# Patient Record
Sex: Female | Born: 1960 | Hispanic: No | Marital: Married | Smoking: Never smoker
Health system: Southern US, Community
[De-identification: ages and names within clinical notes are randomized; demographics above are authoritative.]

## PROBLEM LIST (undated history)

## (undated) DIAGNOSIS — A048 Other specified bacterial intestinal infections: Secondary | ICD-10-CM

## (undated) DIAGNOSIS — Z87440 Personal history of urinary (tract) infections: Secondary | ICD-10-CM

## (undated) DIAGNOSIS — F419 Anxiety disorder, unspecified: Secondary | ICD-10-CM

## (undated) DIAGNOSIS — K219 Gastro-esophageal reflux disease without esophagitis: Secondary | ICD-10-CM

## (undated) DIAGNOSIS — K589 Irritable bowel syndrome without diarrhea: Secondary | ICD-10-CM

## (undated) DIAGNOSIS — K602 Anal fissure, unspecified: Secondary | ICD-10-CM

## (undated) DIAGNOSIS — E785 Hyperlipidemia, unspecified: Secondary | ICD-10-CM

## (undated) DIAGNOSIS — I10 Essential (primary) hypertension: Secondary | ICD-10-CM

## (undated) HISTORY — DX: Gastro-esophageal reflux disease without esophagitis: K21.9

## (undated) HISTORY — DX: Hyperlipidemia, unspecified: E78.5

## (undated) HISTORY — DX: Personal history of urinary (tract) infections: Z87.440

## (undated) HISTORY — DX: Essential (primary) hypertension: I10

## (undated) HISTORY — DX: Other specified bacterial intestinal infections: A04.8

## (undated) HISTORY — DX: Irritable bowel syndrome, unspecified: K58.9

## (undated) HISTORY — DX: Anal fissure, unspecified: K60.2

## (undated) HISTORY — DX: Anxiety disorder, unspecified: F41.9

---

## 1999-01-04 ENCOUNTER — Encounter: Payer: Self-pay | Admitting: Internal Medicine

## 2001-12-05 ENCOUNTER — Other Ambulatory Visit: Admission: RE | Admit: 2001-12-05 | Discharge: 2001-12-05 | Payer: Self-pay | Admitting: Family Medicine

## 2005-03-24 ENCOUNTER — Ambulatory Visit: Payer: Self-pay | Admitting: Family Medicine

## 2005-04-05 ENCOUNTER — Ambulatory Visit: Payer: Self-pay | Admitting: Family Medicine

## 2005-04-06 ENCOUNTER — Ambulatory Visit: Payer: Self-pay | Admitting: Family Medicine

## 2005-04-07 ENCOUNTER — Encounter: Admission: RE | Admit: 2005-04-07 | Discharge: 2005-04-07 | Payer: Self-pay | Admitting: Family Medicine

## 2005-04-11 ENCOUNTER — Encounter: Admission: RE | Admit: 2005-04-11 | Discharge: 2005-04-11 | Payer: Self-pay | Admitting: Family Medicine

## 2005-04-11 ENCOUNTER — Ambulatory Visit: Payer: Self-pay | Admitting: Family Medicine

## 2005-08-25 ENCOUNTER — Ambulatory Visit: Payer: Self-pay | Admitting: Family Medicine

## 2005-10-03 ENCOUNTER — Inpatient Hospital Stay (HOSPITAL_COMMUNITY): Admission: EM | Admit: 2005-10-03 | Discharge: 2005-10-05 | Payer: Self-pay | Admitting: Emergency Medicine

## 2005-10-04 ENCOUNTER — Ambulatory Visit: Payer: Self-pay | Admitting: Cardiology

## 2005-10-12 ENCOUNTER — Ambulatory Visit: Payer: Self-pay | Admitting: Family Medicine

## 2005-10-18 ENCOUNTER — Ambulatory Visit: Payer: Self-pay | Admitting: Family Medicine

## 2005-10-19 ENCOUNTER — Ambulatory Visit: Payer: Self-pay | Admitting: Gastroenterology

## 2005-10-23 ENCOUNTER — Encounter (INDEPENDENT_AMBULATORY_CARE_PROVIDER_SITE_OTHER): Payer: Self-pay | Admitting: Specialist

## 2005-10-23 ENCOUNTER — Ambulatory Visit: Payer: Self-pay | Admitting: Gastroenterology

## 2007-05-21 IMAGING — CR DG CHEST 1V PORT
1 series · 1 of 1 positions shown · non-contrast
Comparison: none

CLINICAL DATA: Chest pain and pressure.  Nausea since am.  No previous cardiac or pulmonary history.  
 PORTABLE CHEST ? 1 VIEW:

[view not recorded]
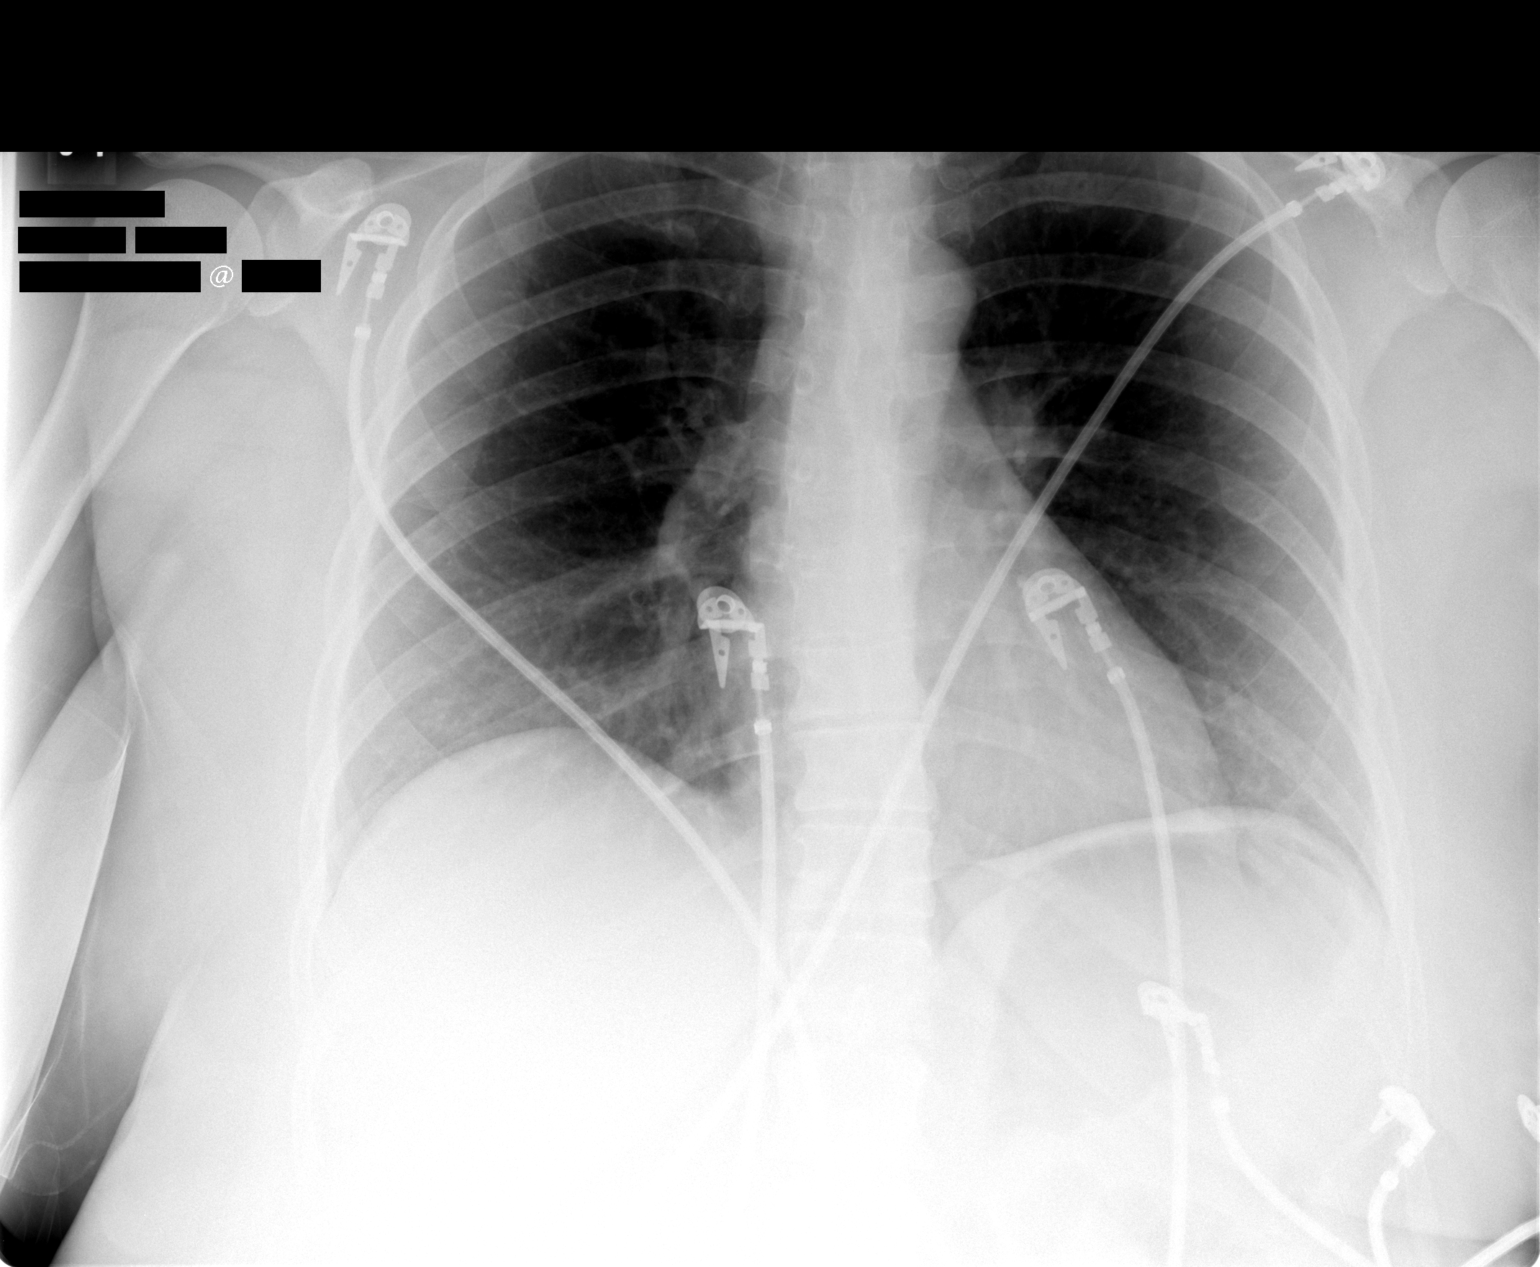

[1 of 1 positions shown; findings below may reference images not displayed]

FINDINGS: An AP seated erect study of the chest using portable technique is made on 10/03/05 at 5189 hours and shows the heart, lungs, bony thorax, and soft tissues to be within the limits of normal.  There is mild diffuse peribronchial thickening.
IMPRESSION: There is no evidence of active cardiopulmonary disease.  Mild diffuse peribronchial thickening.

## 2007-08-20 DIAGNOSIS — F450 Somatization disorder: Secondary | ICD-10-CM | POA: Insufficient documentation

## 2007-08-20 DIAGNOSIS — N390 Urinary tract infection, site not specified: Secondary | ICD-10-CM

## 2007-10-17 ENCOUNTER — Ambulatory Visit: Payer: Self-pay | Admitting: Family Medicine

## 2007-10-17 DIAGNOSIS — R209 Unspecified disturbances of skin sensation: Secondary | ICD-10-CM

## 2007-10-17 LAB — CONVERTED CEMR LAB
ALT: 21 units/L (ref 0–35)
AST: 19 units/L (ref 0–37)
Albumin: 3.4 g/dL — ABNORMAL LOW (ref 3.5–5.2)
Alkaline Phosphatase: 57 units/L (ref 39–117)
BUN: 15 mg/dL (ref 6–23)
Basophils Absolute: 0 10*3/uL (ref 0.0–0.1)
Basophils Relative: 0 % (ref 0.0–1.0)
Bilirubin, Direct: 0.2 mg/dL (ref 0.0–0.3)
CO2: 28 meq/L (ref 19–32)
Calcium: 9.5 mg/dL (ref 8.4–10.5)
Chloride: 105 meq/L (ref 96–112)
Cholesterol: 184 mg/dL (ref 0–200)
Creatinine, Ser: 0.7 mg/dL (ref 0.4–1.2)
Eosinophils Absolute: 0.1 10*3/uL (ref 0.0–0.6)
Eosinophils Relative: 1.7 % (ref 0.0–5.0)
GFR calc Af Amer: 117 mL/min
GFR calc non Af Amer: 97 mL/min
Glucose, Bld: 107 mg/dL — ABNORMAL HIGH (ref 70–99)
HCT: 36.7 % (ref 36.0–46.0)
HDL: 50.7 mg/dL (ref >39.0)
Hemoglobin: 12.4 g/dL (ref 12.0–15.0)
LDL Cholesterol: 106 mg/dL — ABNORMAL HIGH (ref 0–99)
Lymphocytes Relative: 26.3 % (ref 12.0–46.0)
MCHC: 33.9 g/dL (ref 30.0–36.0)
MCV: 91 fL (ref 78.0–100.0)
Monocytes Absolute: 0.7 10*3/uL (ref 0.2–0.7)
Monocytes Relative: 9.2 % (ref 3.0–11.0)
Neutro Abs: 4.5 10*3/uL (ref 1.4–7.7)
Neutrophils Relative %: 62.8 % (ref 43.0–77.0)
Platelets: 313 10*3/uL (ref 150–400)
Potassium: 4 meq/L (ref 3.5–5.1)
RBC: 4.03 M/uL (ref 3.87–5.11)
RDW: 13.3 % (ref 11.5–14.6)
Sodium: 140 meq/L (ref 135–145)
TSH: 1.04 microintl units/mL (ref 0.35–5.50)
Total Bilirubin: 0.6 mg/dL (ref 0.3–1.2)
Total CHOL/HDL Ratio: 3.6
Total Protein: 6.4 g/dL (ref 6.0–8.3)
Triglycerides: 137 mg/dL (ref 0–149)
VLDL: 27 mg/dL (ref 0–40)
WBC: 7.2 10*3/uL (ref 4.5–10.5)

## 2008-07-27 ENCOUNTER — Ambulatory Visit: Payer: Self-pay | Admitting: Family Medicine

## 2008-08-21 ENCOUNTER — Ambulatory Visit: Payer: Self-pay | Admitting: Family Medicine

## 2008-08-21 ENCOUNTER — Encounter: Payer: Self-pay | Admitting: Family Medicine

## 2008-08-21 ENCOUNTER — Other Ambulatory Visit: Admission: RE | Admit: 2008-08-21 | Discharge: 2008-08-21 | Payer: Self-pay | Admitting: Family Medicine

## 2008-10-12 ENCOUNTER — Encounter: Admission: RE | Admit: 2008-10-12 | Discharge: 2008-10-12 | Payer: Self-pay | Admitting: Family Medicine

## 2008-11-04 ENCOUNTER — Telehealth: Payer: Self-pay | Admitting: Family Medicine

## 2009-03-12 ENCOUNTER — Emergency Department (HOSPITAL_BASED_OUTPATIENT_CLINIC_OR_DEPARTMENT_OTHER): Admission: EM | Admit: 2009-03-12 | Discharge: 2009-03-12 | Payer: Self-pay | Admitting: Emergency Medicine

## 2009-10-15 ENCOUNTER — Encounter: Admission: RE | Admit: 2009-10-15 | Discharge: 2009-10-15 | Payer: Self-pay | Admitting: Family Medicine

## 2010-05-27 ENCOUNTER — Encounter: Admission: RE | Admit: 2010-05-27 | Discharge: 2010-05-27 | Payer: Self-pay | Admitting: Obstetrics & Gynecology

## 2010-11-25 ENCOUNTER — Ambulatory Visit
Admission: RE | Admit: 2010-11-25 | Discharge: 2010-11-25 | Payer: Self-pay | Source: Home / Self Care | Attending: Family Medicine | Admitting: Family Medicine

## 2010-11-25 LAB — CONVERTED CEMR LAB
Bilirubin Urine: NEGATIVE
Glucose, Urine, Semiquant: NEGATIVE
Ketones, urine, test strip: NEGATIVE
Nitrite: NEGATIVE
Protein, U semiquant: NEGATIVE
Specific Gravity, Urine: 1.01
Urobilinogen, UA: 0.2

## 2010-12-04 LAB — CONVERTED CEMR LAB
ALT: 24 units/L (ref 0–35)
ALT: 24 units/L (ref 0–35)
AST: 24 units/L (ref 0–37)
AST: 24 units/L (ref 0–37)
Albumin: 3.8 g/dL (ref 3.5–5.2)
Albumin: 3.8 g/dL (ref 3.5–5.2)
Alkaline Phosphatase: 58 units/L (ref 39–117)
Alkaline Phosphatase: 58 units/L (ref 39–117)
BUN: 12 mg/dL (ref 6–23)
BUN: 12 mg/dL (ref 6–23)
Basophils Absolute: 0 10*3/uL (ref 0.0–0.1)
Basophils Absolute: 0 10*3/uL (ref 0.0–0.1)
Basophils Relative: 0.4 % (ref 0.0–3.0)
Basophils Relative: 0.4 % (ref 0.0–3.0)
Bilirubin Urine: NEGATIVE
Bilirubin, Direct: 0.2 mg/dL (ref 0.0–0.3)
Bilirubin, Direct: 0.2 mg/dL (ref 0.0–0.3)
CO2: 30 meq/L (ref 19–32)
CO2: 30 meq/L (ref 19–32)
Calcium: 9.3 mg/dL (ref 8.4–10.5)
Calcium: 9.3 mg/dL (ref 8.4–10.5)
Chloride: 103 meq/L (ref 96–112)
Chloride: 103 meq/L (ref 96–112)
Cholesterol: 225 mg/dL (ref 0–200)
Cholesterol: 225 mg/dL (ref 0–200)
Creatinine, Ser: 0.7 mg/dL (ref 0.4–1.2)
Creatinine, Ser: 0.7 mg/dL (ref 0.4–1.2)
Direct LDL: 132.4 mg/dL
Direct LDL: 132.4 mg/dL
Eosinophils Absolute: 0.1 10*3/uL (ref 0.0–0.7)
Eosinophils Absolute: 0.1 10*3/uL (ref 0.0–0.7)
Eosinophils Relative: 1.1 % (ref 0.0–5.0)
Eosinophils Relative: 1.1 % (ref 0.0–5.0)
GFR calc Af Amer: 116 mL/min
GFR calc Af Amer: 116 mL/min
GFR calc non Af Amer: 96 mL/min
GFR calc non Af Amer: 96 mL/min
Glucose, Bld: 93 mg/dL (ref 70–99)
Glucose, Bld: 93 mg/dL (ref 70–99)
Glucose, Urine, Semiquant: NEGATIVE
HCT: 37.5 % (ref 36.0–46.0)
HCT: 37.5 % (ref 36.0–46.0)
HDL: 63.9 mg/dL (ref >39.0)
HDL: 63.9 mg/dL (ref >39.0)
Hemoglobin: 13.1 g/dL (ref 12.0–15.0)
Hemoglobin: 13.1 g/dL (ref 12.0–15.0)
Lymphocytes Relative: 23 % (ref 12.0–46.0)
Lymphocytes Relative: 23 % (ref 12.0–46.0)
MCHC: 35 g/dL (ref 30.0–36.0)
MCHC: 35 g/dL (ref 30.0–36.0)
MCV: 90.8 fL (ref 78.0–100.0)
MCV: 90.8 fL (ref 78.0–100.0)
Monocytes Absolute: 0.8 10*3/uL (ref 0.1–1.0)
Monocytes Absolute: 0.8 10*3/uL (ref 0.1–1.0)
Monocytes Relative: 7.9 % (ref 3.0–12.0)
Monocytes Relative: 7.9 % (ref 3.0–12.0)
Neutro Abs: 6.4 10*3/uL (ref 1.4–7.7)
Neutro Abs: 6.4 10*3/uL (ref 1.4–7.7)
Neutrophils Relative %: 67.6 % (ref 43.0–77.0)
Neutrophils Relative %: 67.6 % (ref 43.0–77.0)
Nitrite: POSITIVE
Platelets: 285 10*3/uL (ref 150–400)
Platelets: 285 10*3/uL (ref 150–400)
Potassium: 4.1 meq/L (ref 3.5–5.1)
Potassium: 4.1 meq/L (ref 3.5–5.1)
RBC: 4.13 M/uL (ref 3.87–5.11)
RBC: 4.13 M/uL (ref 3.87–5.11)
RDW: 12.6 % (ref 11.5–14.6)
RDW: 12.6 % (ref 11.5–14.6)
Sodium: 141 meq/L (ref 135–145)
Sodium: 141 meq/L (ref 135–145)
Specific Gravity, Urine: 1.02
TSH: 1.37 microintl units/mL (ref 0.35–5.50)
TSH: 1.37 microintl units/mL (ref 0.35–5.50)
Total Bilirubin: 1.2 mg/dL (ref 0.3–1.2)
Total Bilirubin: 1.2 mg/dL (ref 0.3–1.2)
Total CHOL/HDL Ratio: 3.5
Total CHOL/HDL Ratio: 3.5
Total Protein: 7.4 g/dL (ref 6.0–8.3)
Total Protein: 7.4 g/dL (ref 6.0–8.3)
Triglycerides: 67 mg/dL (ref 0–149)
Triglycerides: 67 mg/dL (ref 0–149)
Urobilinogen, UA: 0.2
VLDL: 13 mg/dL (ref 0–40)
VLDL: 13 mg/dL (ref 0–40)
WBC: 9.5 10*3/uL (ref 4.5–10.5)
WBC: 9.5 10*3/uL (ref 4.5–10.5)
pH: 6.5

## 2010-12-06 ENCOUNTER — Encounter: Payer: Self-pay | Admitting: Family Medicine

## 2010-12-08 NOTE — Assessment & Plan Note (Signed)
Summary: BLADDER INF // RS   Vital Signs:  Patient profile:   50 year old female Height:      61.5 inches Weight:      196 pounds BMI:     36.57 Temp:     98.1 degrees F oral BP sitting:   130 / 90  (left arm) Cuff size:   regular  Vitals Entered By: Kern Reap CMA Duncan Dull) (November 25, 2010 11:07 AM) CC: dysuria   CC:  dysuria.  History of Present Illness: [name] is a 50 year old female, who comes in today with a month's history of dysuria.  About a month ago she began having burning on urination.  She's had no fever, chills, or back pain.  She's had nocturia 5 to 6 times and dribbling.  She states that last year.  She went to urologist and had a workup because of hematuria.  Workup was negative.  LMP 111 to 116 normal except she, states she's having a lot of clots.  She is not sure when her last physical was  Allergies: 1)  ! * Antibiotic For Uti--Does Not Know Name  Past History:  Past medical, surgical, family and social histories (including risk factors) reviewed for relevance to current acute and chronic problems.  Past Medical History: Reviewed history from 10/17/2007 and no changes required. UTI's, recurrent somatization  childbirth x 3 BTL  Past Surgical History: Reviewed history from 08/20/2007 and no changes required. CB X3  vagi  1987, 1989, 1990 Tubal ligation  1990  Family History: Reviewed history from 10/17/2007 and no changes required. Family History Hypertension Family History of Stroke F 1st degree relative <60  Social History: Reviewed history from 10/17/2007 and no changes required. Occupation: Research scientist (medical) Married Never Smoked Alcohol use-no Drug use-no Regular exercise-yes  Review of Systems      See HPI  Physical Exam  General:  Well-developed,well-nourished,in no acute distress; alert,appropriate and cooperative throughout examination Abdomen:  Bowel sounds positive,abdomen soft and non-tender without masses, organomegaly or  hernias noted.   Impression & Recommendations:  Problem # 1:  UTI'S, RECURRENT (ICD-599.0) Assessment Deteriorated  Her updated medication list for this problem includes:    Ciprofloxacin Hcl 250 Mg Tabs (Ciprofloxacin hcl) .Marland Kitchen... Take 1 tablet by mouth two times a day x 1 week, then 1 at bedtime x 3 weeks  Orders: UA Dipstick w/o Micro (manual) (40814)  Complete Medication List: 1)  Ciprofloxacin Hcl 250 Mg Tabs (Ciprofloxacin hcl) .... Take 1 tablet by mouth two times a day x 1 week, then 1 at bedtime x 3 weeks  Patient Instructions: 1)  begin Cipro one tablet twice daily for one week, then one tablet daily at bedtime for 3 weeks 2)  Return the second week in March for a complete physical examination 3)  BMP prior to visit, ICD-9:............v70.0 4)  Hepatic Panel prior to visit, ICD-9: 5)  Lipid Panel prior to visit, ICD-9: 6)  TSH prior to visit, ICD-9: 7)  CBC w/ Diff prior to visit, ICD-9: 8)  Urine-dip prior to visit, ICD-9: Prescriptions: CIPROFLOXACIN HCL 250 MG TABS (CIPROFLOXACIN HCL) Take 1 tablet by mouth two times a day x 1 week, then 1 at bedtime x 3 weeks  #35 x 1   Entered by:   Kern Reap CMA (AAMA)   Authorized by:   Roderick Pee MD   Signed by:   Kern Reap CMA (AAMA) on 11/25/2010   Method used:   Faxed to [facility]  [address]  [phone]* (retail)       [address]       [place], [place]  [address]       Ph: [phone] or [phone]       Fax: [phone]   RxID:   [id number] CIPROFLOXACIN HCL 250 MG TABS (CIPROFLOXACIN HCL) Take 1 tablet by mouth two times a day x 1 week, then 1 at bedtime x 3 weeks  #35 x 1   Entered and Authorized by:   Roderick Pee MD   Signed by:   Roderick Pee MD on 11/25/2010   Method used:   Electronically to        [facility]  [phone]* (retail)       [address]       [place], [place]  [address]       Ph: [phone] or  [phone]       Fax: [phone]   RxID:   [id number] CIPROFLOXACIN HCL 250 MG TABS (CIPROFLOXACIN HCL) Take 1 tablet by mouth two times a day x 1 week, then 1 at bedtime x 3 weeks  #35 x 1   Entered and Authorized by:   Roderick Pee MD   Signed by:   Roderick Pee MD on 11/25/2010   Method used:   Print then Give to Patient   RxID:   [id number]    Orders Added: 1)  Est. Patient Level III [69485] 2)  UA Dipstick w/o Micro (manual) [81002]    Laboratory Results   Urine Tests  Date/Time Received: November 25, 2010   Routine Urinalysis   Color: yellow Appearance: Clear Glucose: negative   (Normal Range: Negative) Bilirubin: negative   (Normal Range: Negative) Ketone: negative   (Normal Range: Negative) Spec. Gravity: 1.010   (Normal Range: 1.003-1.035) Blood: moderate   (Normal Range: Negative) pH: 6.5   (Normal Range: 5.0-8.0) Protein: negative   (Normal Range: Negative) Urobilinogen: 0.2   (Normal Range: 0-1) Nitrite: negative   (Normal Range: Negative) Leukocyte Esterace: small   (Normal Range: Negative)    Comments: Kern Reap CMA Duncan Dull)  November 25, 2010 5:40 PM

## 2011-01-13 ENCOUNTER — Other Ambulatory Visit: Payer: Self-pay

## 2011-01-20 ENCOUNTER — Encounter: Payer: Self-pay | Admitting: Family Medicine

## 2011-02-14 LAB — URINALYSIS, ROUTINE W REFLEX MICROSCOPIC
Bilirubin Urine: NEGATIVE
Glucose, UA: NEGATIVE mg/dL
Ketones, ur: 15 mg/dL — AB
Nitrite: NEGATIVE
Protein, ur: NEGATIVE mg/dL
Specific Gravity, Urine: 1.011 (ref 1.005–1.030)
Urobilinogen, UA: 0.2 mg/dL (ref 0.0–1.0)
pH: 6 (ref 5.0–8.0)

## 2011-02-14 LAB — URINE MICROSCOPIC-ADD ON

## 2011-03-24 NOTE — Discharge Summary (Signed)
[name], [name]               ACCOUNT NO.:  [id number]   MEDICAL RECORD NO.:  [id number]          PATIENT TYPE:  INP   LOCATION:  4731                         FACILITY:  [facility]   PHYSICIAN:  Dorian Pod, NP    DATE OF BIRTH:  1961/01/14   DATE OF ADMISSION:  10/03/2005  DATE OF DISCHARGE:  10/05/2005                                 DISCHARGE SUMMARY   DISCHARGE DIAGNOSIS:  Chest pain, noncardiac, status post cardiac  catheterization on October 05, 2005 with angiographically normal coronary  arteries, ejection of 65% without regional wall motion abnormalities,  questionable gastrointestinal etiology.  Cardiologist, Dr. Simona Huh.  Primary care doctor, Dr. Gardiner Fanti.   PAST MEDICAL HISTORY:  Hyperlipidemia, diet-controlled.   HOSPITAL COURSE:  [name] is a 50 year old female with no known coronary  artery disease, who presented on day of admission complaining of a heavy  feeling in her chest that started during the night.  [name] had an EKG  with normal sinus rhythm without ST or  wave changes.  Point of care enzymes  negative.  D-dimer within normal limits.  Liver enzymes within normal  limits.  Lipase and amylase within normal limits.  However, mild history of  hyperlipidemia.  [name] also complained of some mild upper abdominal  discomfort.  Ultrasound of the abdomen showing no gallstones.  Hyperechoic  lesion in the dome of the liver may represent hemangioma.  Will do followup  CT for further delineation.  CT of the chest showed no evidence of active  cardiopulmonary disease.  The patient admitted to rule out.  Cardiac enzymes  negative.  Plan was for stress Myoview to evaluate for ischemia.  Ms.  Cyran stress test was abnormal with recorded hypotension during  exercise.  Perfusion studies showed transient dilatation suggesting  multivessel or subendocardial ischemia.  It was decided to proceed with a  cardiac catheterization in the setting of an  abnormal stress test.  The  patient was taken to the cath lab on October 05, 2005 by Dr. Randa Evens.  Cardiac catheterization essentially normal.  The patient without  further complaints of any chest discomfort.  Started on PPI and discharged  home to follow up with her primary care physician, Dr. Gardiner Fanti.   At discharge, [name] was instructed to refrain from driving for two  days and lifting over 10 pounds x1 week.  May shower, no tub bathing x2  days.  She was given a prescription for Protonix 40 mg daily.  She was to  call and  schedule an appointment with Dr. Lin Givens within the next two weeks after  discharge.  She was also given discharge instructions after cardiac  catheterization.  Instructed to call our office for any problems from cath  site.   DURATION OF DISCHARGE ENCOUNTER:  Thirty minutes.      Dorian Pod, NP     MB/MEDQ  D:  11/20/2005  T:  11/20/2005  Job:  [id number]   cc:   Jonelle Sidle, M.D. [facility]  [address]  [place]  [place] [address]   Gardiner Fanti, M.D.

## 2011-03-24 NOTE — Cardiovascular Report (Signed)
NAMEABIGAILE, ROSSIE               ACCOUNT NO.:  [id number]   MEDICAL RECORD NO.:  [id number]          PATIENT TYPE:  INP   LOCATION:  4731                         FACILITY:  [facility]   PHYSICIAN:  Salvadore Farber, M.D. LHCDATE OF BIRTH:  17-Dec-1960   DATE OF PROCEDURE:  09/16/2005  DATE OF DISCHARGE:  10/05/2005                              CARDIAC CATHETERIZATION   PROCEDURE:  Left heart catheterization, left ventriculography, coronary  angiography.   INDICATIONS:  Ms. Khurana is a 50 year old woman without prior history of  cardiovascular disease. She presents with episode of nausea and chest  discomfort. She describes the chest discomfort as constant and x2 days. She  was hospitalized with a normal electrocardiogram. She had a normal d-dimer.  She ruled out for myocardial infarction by serial enzymes and  electrocardiograms. She underwent exercise Cardiolite yesterday. The Myoview  images reportedly demonstrated LV dilation with exercise. She was therefore  referred for diagnostic angiography.   PROCEDURE TECHNIQUE:  Informed consent was obtained. Under 1% lidocaine  local anesthesia, a 5-French sheath was placed in the right common femoral  artery using modified Seldinger technique. Diagnostic angiography and  ventriculography were performed using JL-4 catheter for the native left  system, Amplatz left one catheter for the native right, and pigtail catheter  for the ventriculogram. The patient tolerated the procedure well. Sheath was  removed on the table. Complete hemostasis was obtained. She was then  transferred to holding room in stable condition.   COMPLICATIONS:  None.   FINDINGS:  1.  LV: 131/5/10. EF 65% without regional wall motion abnormality.  2.  No aortic stenosis or mitral regurgitation.  3.  Left main: Angiographically normal.  4.  LAD:  Moderate-sized vessel giving rise to a single diagonal. It is      angiographically normal.  5.  Circumflex:   Moderate-sized vessel giving rise to a single obtuse      marginal. It is angiographically normal.  6.  RCA:  Moderate-sized vessel. There is a high anterior takeoff. It is      angiographically normal.   IMPRESSION/PLAN:  The patient has angiographically normal coronary arteries.  There is normal left ventricular size and systolic function. The etiology of  her chest discomfort and nausea remains unclear. However, with no recurrence  for two days, a negative d-dimer, and the findings on this study, I think  she is safe for discharge home with further evaluation by her primary care  doctor should symptoms recur.      Salvadore Farber, M.D. [facility]  Electronically Signed     WED/MEDQ  D:  10/05/2005  T:  10/06/2005  Job:  [id number]   cc:   Tinnie Gens A. Tawanna Cooler, M.D. [facility]  [address] [place]  [place] [address]   Jonelle Sidle, M.D. [facility]  [address]  [place]  [place] [address]

## 2011-03-24 NOTE — H&P (Signed)
[name], [name]               ACCOUNT NO.:  [id number]   MEDICAL RECORD NO.:  [id number]          PATIENT TYPE:  OBV   LOCATION:  1826                         FACILITY:  [facility]   PHYSICIAN:  Jonelle Sidle, M.D. LHCDATE OF BIRTH:  1961-01-28   DATE OF ADMISSION:  10/03/2005  DATE OF DISCHARGE:                                HISTORY & PHYSICAL   CARDIOLOGIST:  New - being seen by Dr. Simona Huh.   PRIMARY CARE:  Dr. Gardiner Fanti   CHIEF COMPLAINT:  Chest heaviness.   [name] is a 50 year old [place] female who presents to [facility]  emergency room via EMS with complaints of chest heaviness. She states  initial discomfort began last evening after she had gone to bed. She  apparently got up to go to the bathroom and noticed a heavy feeling in her  chest. It was generalized. No associated symptoms at that time. She went  back to bed and eventually fell asleep. She woke up this morning  experiencing the same heaviness, at that time associated with nausea and  dizziness. She got dressed, went to work, continued to have the discomfort.  States she had some blurred vision also at this time. EMS was notified and  the patient transported to Urgent Care on [address] EKG done at that  facility without any ST or T wave changes. The patient was transferred to  [facility] for further evaluation. [name] rates her chest heaviness  around a 3 on a scale of 1-10. Denies any diaphoresis or shortness of  breath. She states she has been doing a lot of belching and currently states  the discomfort seems to be lower in her epigastric area and radiate upward.  She also has some tenderness in her right upper quadrant to palpation. She  also complains of increased fatigue. She states she has gained approximately  50 pounds over the last year, has a very sedentary lifestyle. She also  becomes dyspneic on exertion with minimal activity.   ALLERGIES:  Include PENICILLIN.   MEDICATIONS:  None.   PAST MEDICAL HISTORY:  Includes mild hyperlipidemia in 2005, diet  controlled.   SOCIAL HISTORY:  She lives in [place] with her husband. 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 She has three children, healthy. Denies  any EtOH, tobacco, drug, or herbal medication use. No diet restrictions. No  exercise.   FAMILY HISTORY:  Mother alive with history of coronary artery disease status  post PTCA in her 71s. Also has a remote history of TIA and diabetes. Father  alive and well. She has one brother who has diabetes.   REVIEW OF SYSTEMS:  Positive for weight change as stated above, blurred  vision today, chest pain, dyspnea on exertion, some peripheral edema in  hands, and dizziness. GU:  Complains of nocturia. MS:  Complains of some  right heel pain. GI:  Complains of nausea. ENDO:  She complains of polyuria  and cold intolerance.   PHYSICAL EXAMINATION:  VITAL SIGNS:  Temperature 97.2, pulse is 80,  respirations 22, blood pressure 119/77, she  is saturating 99% in room air.  GENERAL:  She is in no acute distress, very pleasant and cooperative, well-  nourished female.  HEENT:  Pupils equal, round, and reactive to light. Sclerae clear.  Dentition:  Teeth.  NECK:  Supple without lymphadenopathy. Negative bruit or JVD.  CARDIOVASCULAR:  Heart rate regular rhythm, S1, S2. Pulses are 2+ and equal  without bruits.  LUNGS:  Clear to auscultation bilaterally.  SKIN:  Without rashes or lesions.  ABDOMEN:  Soft. She has tenderness to palpation in her right upper quadrant.  Positive bowel sounds.  EXTREMITIES:  Without clubbing, cyanosis, or edema. She has 2+ DPs  bilaterally.  MUSCULOSKELETAL:  No spine or CVA tenderness.  NEUROLOGIC:  She is alert and oriented x3. Cranial nerves II-XII grossly  intact.   Chest x-ray without acute findings. EKG at a rate of 76, sinus rhythm,  without ST or T wave change. Lab work showing a white blood cell count of   10.8; hemoglobin 12.8; hematocrit 37; platelets 274,000. Sodium 135,  potassium 3.8, BUN 15, creatinine 0.6, with a glucose of 91. AST 21, ALT is  18, AP is 6.2, amylase 48, lipase 22, total protein 0.7, albumin 3.2. Point-  of-care markers:  Troponin less than 0.05. D-dimer 0.27.   Dr. Simona Huh in to examine and assess the patient. EKG was essentially  normal. Initial troponin I negative. Chest x-ray unremarkable. Amylase,  lipase, CFTs normal.   PLAN:  Admit, observation, for serial markers, abdominal ultrasound, and  likely exercise Myoview in the a.m. if cardiac enzymes negative.      Dorian Pod, NP    ______________________________  Jonelle Sidle, M.D. [facility]    MB/MEDQ  D:  10/03/2005  T:  10/03/2005  Job:  [id number]

## 2011-08-18 ENCOUNTER — Other Ambulatory Visit: Payer: Self-pay | Admitting: Obstetrics & Gynecology

## 2011-08-18 DIAGNOSIS — R928 Other abnormal and inconclusive findings on diagnostic imaging of breast: Secondary | ICD-10-CM

## 2011-09-04 ENCOUNTER — Encounter: Payer: Self-pay | Admitting: Gastroenterology

## 2011-09-05 ENCOUNTER — Ambulatory Visit
Admission: RE | Admit: 2011-09-05 | Discharge: 2011-09-05 | Disposition: A | Discharge status: Home / Self Care | Payer: Commercial Indemnity | Source: Ambulatory Visit | Attending: Obstetrics & Gynecology | Admitting: Obstetrics & Gynecology

## 2011-09-05 DIAGNOSIS — R928 Other abnormal and inconclusive findings on diagnostic imaging of breast: Secondary | ICD-10-CM

## 2011-09-21 ENCOUNTER — Ambulatory Visit: Payer: Self-pay | Admitting: Gastroenterology

## 2011-09-25 ENCOUNTER — Ambulatory Visit: Payer: Self-pay | Admitting: Gastroenterology

## 2011-10-11 ENCOUNTER — Ambulatory Visit (INDEPENDENT_AMBULATORY_CARE_PROVIDER_SITE_OTHER): Payer: Commercial Indemnity | Admitting: Gastroenterology

## 2011-10-11 ENCOUNTER — Encounter: Payer: Self-pay | Admitting: Gastroenterology

## 2011-10-11 DIAGNOSIS — R159 Full incontinence of feces: Secondary | ICD-10-CM

## 2011-10-11 DIAGNOSIS — K589 Irritable bowel syndrome without diarrhea: Secondary | ICD-10-CM

## 2011-10-11 MED ORDER — PEG-KCL-NACL-NASULF-NA ASC-C 100 G PO SOLR: 1.0000 | Freq: Once | ORAL | Status: DC

## 2011-10-11 NOTE — Patient Instructions (Addendum)
You have been scheduled for a Colonoscopy. See separate instructions.  Pick up your prep kit from your pharmacy.  High Fiber diet given. cc: Varney Baas, MD

## 2011-10-11 NOTE — Progress Notes (Signed)
History of Present Illness: This is a 50 year old female with complaints of occasional anal leakage of small amounts of brown liquid, gas, bloating, and urgent bowel movements. She has 3 bowel movements each day that are frequently urgent. I previously evaluated her for GERD in 2006 and she underwent upper endoscopy that showed GERD and gastritis. She was treated with pantoprazole at that time her symptoms resolved.  She was evaluated by Dr. Kinnie Scales, on referral from Dr. Jennette Kettle, in 2011 for anorectal discomfort, fecal soiling and intestinal gas. She was felt to have internal hemorrhoids on anoscopy. She was advised to return to Dr. Kinnie Scales in October for her current complaints but apparently her insurance is no longer accepted by his practice and she presented to see me today. Denies weight loss, abdominal pain, constipation, diarrhea, change in stool caliber, melena, hematochezia, nausea, vomiting, dysphagia, reflux symptoms, chest pain.  No Known Allergies No outpatient prescriptions prior to visit.   Past Medical History  Diagnosis Date  . Hyperlipidemia   . GERD (gastroesophageal reflux disease)   . H. pylori infection   . Anal fissure     hx of   . Irritable bowel syndrome (IBS)   . Hx: UTI (urinary tract infection)    No past surgical history on file. History   Social History  . Marital Status: Married    Spouse Name: N/A    Number of Children: 3  . Years of Education: N/A   Social History Main Topics  . Smoking status: Never Smoker   . Smokeless tobacco: Never Used  . Alcohol Use: No  . Drug Use: No  . Sexually Active: None   Other Topics Concern  . None   Social History Narrative   Caffeine daily    Family History  Problem Relation Age of Onset  . Colon cancer Neg Hx     Review of Systems: Pertinent positive and negative review of systems were noted in the above HPI section. All other review of systems were otherwise negative.  Current Medications, Allergies, Past  Medical History, Past Surgical History, Family History and Social History were reviewed in [employer] record.  Physical Exam: General: Well developed , well nourished, no acute distress Head: Normocephalic and atraumatic Eyes:  sclerae anicteric, EOMI Ears: Normal auditory acuity Mouth: No deformity or lesions Neck: Supple, no masses or thyromegaly Lungs: Clear throughout to auscultation Heart: Regular rate and rhythm; no murmurs, rubs or bruits Abdomen: Soft, non tender and non distended. No masses, hepatosplenomegaly or hernias noted. Normal Bowel sounds Rectal: No external or internal abnormalities noted. Hemoccult negative mucus in the vault and a decreased anal squeeze Musculoskeletal: Symmetrical with no gross deformities  Skin: No lesions on visible extremities Pulses:  Normal pulses noted Extremities: No clubbing, cyanosis, edema or deformities noted Neurological: Alert oriented x 4, grossly nonfocal Cervical Nodes:  No significant cervical adenopathy Inguinal Nodes: No significant inguinal adenopathy Psychological:  Alert and cooperative. Normal mood and affect  Assessment and Recommendations:  1. Small volume fecal/mucous incontinence likely related to decreased anal sphincter tone. She likely has underlying irritable bowel syndrome with frequent, urgent bowel movements and intestinal gas. She may need further evaluation and treatment at [facility] with anorectal manometry and other evaluations and treatments for fecal incontinence as appropriate. In the interim she is advised to increase her dietary fiber and water intake and to begin a probiotic. She is reluctant to do either. I advised her to proceed with colonoscopy to rule out  inflammatory bowel disease, colorectal neoplasms, hemorrhoids and other disorders. The risks, benefits, and alternatives to colonoscopy with possible biopsy and possible polypectomy were discussed with the patient and they  consent to proceed.

## 2011-10-12 ENCOUNTER — Encounter: Payer: Self-pay | Admitting: Gastroenterology

## 2011-10-16 ENCOUNTER — Encounter: Payer: Self-pay | Admitting: Gastroenterology

## 2011-10-16 ENCOUNTER — Ambulatory Visit (AMBULATORY_SURGERY_CENTER): Payer: Commercial Indemnity | Admitting: Gastroenterology

## 2011-10-16 VITALS — BP 148/73 | HR 69 | Temp 97.7°F | Resp 18 | Ht 63.0 in | Wt 173.0 lb

## 2011-10-16 DIAGNOSIS — R159 Full incontinence of feces: Secondary | ICD-10-CM

## 2011-10-16 DIAGNOSIS — K589 Irritable bowel syndrome without diarrhea: Secondary | ICD-10-CM

## 2011-10-16 DIAGNOSIS — Z1211 Encounter for screening for malignant neoplasm of colon: Secondary | ICD-10-CM

## 2011-10-16 MED ORDER — SODIUM CHLORIDE 0.9 % IV SOLN: 500.0000 mL | INTRAVENOUS | Status: DC

## 2011-10-16 NOTE — Progress Notes (Signed)
Patient did not experience any of the following events: a burn prior to discharge; a fall within the facility; wrong site/side/patient/procedure/implant event; or a hospital transfer or hospital admission upon discharge from the facility. (G8907) Patient did not have preoperative order for IV antibiotic SSI prophylaxis. (G8918)  

## 2011-10-16 NOTE — Op Note (Addendum)
[facility] [address] [place], [place]  [address]  COLONOSCOPY PROCEDURE REPORT  PATIENT:  [name], [name]  MR#:  [id number] BIRTHDATE:  1960/11/08, 50 yrs. old  GENDER:  female ENDOSCOPIST:  Judie Petit T. Russella Dar, MD, [facility] Referred by:  Varney Baas, M.D. PROCEDURE DATE:  10/16/2011 PROCEDURE:  Colonoscopy 46962 ASA CLASS:  Class II INDICATIONS:    fecal incontinence, IBS, routine risk screening MEDICATIONS:   These medications were titrated to patient response per physician's verbal order, Fentanyl 75 mcg IV, Versed 7 mg IV DESCRIPTION OF PROCEDURE:   After the risks benefits and alternatives of the procedure were thoroughly explained, informed consent was obtained.  Digital rectal exam was performed and revealed no abnormalities.   The LB 180AL [id number] endoscope was introduced through the anus and advanced to the cecum, which was identified by both the appendix and ileocecal valve, without limitations.  The quality of the prep was good, using MoviPrep. The instrument was then slowly withdrawn as the colon was fully examined. <<PROCEDUREIMAGES>> FINDINGS:  Mild diverticulosis was found in the sigmoid colon. Otherwise normal colonoscopy without other polyps, masses, vascular ectasias, or inflammatory changes.  Retroflexed views in the rectum revealed no abnormalities.  The time to cecum =  2.5 minutes. The scope was then withdrawn (time =  9  min) from the patient and the procedure completed.  COMPLICATIONS:  None  ENDOSCOPIC IMPRESSION: 1) Mild diverticulosis in the sigmoid colon  RECOMMENDATIONS: 1) Continue current colorectal screening s for "routine risk" patients with a repeat colonoscopy in 10 years.  Venita Lick. Russella Dar, MD, [facility]  n. REVISED:  10/16/2011 08:30 PM eSIGNED:   Venita Lick. Linzee Depaul at 10/16/2011 08:30 PM  Kipp Brood, [phone]

## 2011-10-16 NOTE — Patient Instructions (Signed)
FOLLOW DISCHARGE INSTRUCTIONS (BLUE & GREEN SHEETS).  INFORMATION GIVEN TO YOU ON DIVERTICULOSIS & HIGH FIBER DIET

## 2011-10-17 ENCOUNTER — Telehealth: Payer: Self-pay | Admitting: *Deleted

## 2011-10-17 NOTE — Telephone Encounter (Signed)
Message left

## 2013-01-04 ENCOUNTER — Ambulatory Visit: Payer: Managed Care, Other (non HMO) | Admitting: Emergency Medicine

## 2013-01-04 ENCOUNTER — Emergency Department (HOSPITAL_COMMUNITY)
Admission: EM | Admit: 2013-01-04 | Discharge: 2013-01-04 | Disposition: A | Discharge status: Home / Self Care | Payer: Commercial Indemnity | Attending: Emergency Medicine | Admitting: Emergency Medicine

## 2013-01-04 ENCOUNTER — Encounter (HOSPITAL_COMMUNITY): Payer: Self-pay

## 2013-01-04 VITALS — BP 152/92 | HR 83 | Temp 98.3°F | Resp 16 | Ht 62.5 in | Wt 165.0 lb

## 2013-01-04 DIAGNOSIS — Z8619 Personal history of other infectious and parasitic diseases: Secondary | ICD-10-CM | POA: Insufficient documentation

## 2013-01-04 DIAGNOSIS — R55 Syncope and collapse: Secondary | ICD-10-CM | POA: Insufficient documentation

## 2013-01-04 DIAGNOSIS — Z87440 Personal history of urinary (tract) infections: Secondary | ICD-10-CM | POA: Insufficient documentation

## 2013-01-04 DIAGNOSIS — Z8719 Personal history of other diseases of the digestive system: Secondary | ICD-10-CM | POA: Insufficient documentation

## 2013-01-04 DIAGNOSIS — Z862 Personal history of diseases of the blood and blood-forming organs and certain disorders involving the immune mechanism: Secondary | ICD-10-CM | POA: Insufficient documentation

## 2013-01-04 DIAGNOSIS — Z8639 Personal history of other endocrine, nutritional and metabolic disease: Secondary | ICD-10-CM | POA: Insufficient documentation

## 2013-01-04 LAB — COMPREHENSIVE METABOLIC PANEL
Alkaline Phosphatase: 64 U/L (ref 39–117)
BUN: 7 mg/dL (ref 6–23)
CO2: 28 mEq/L (ref 19–32)
Chloride: 97 mEq/L (ref 96–112)
GFR calc Af Amer: >90 mL/min (ref >90)
GFR calc non Af Amer: >90 mL/min (ref >90)
Glucose, Bld: 106 mg/dL — ABNORMAL HIGH (ref 70–99)
Potassium: 3.6 mEq/L (ref 3.5–5.1)
Total Bilirubin: 0.2 mg/dL — ABNORMAL LOW (ref 0.3–1.2)

## 2013-01-04 LAB — GLUCOSE, CAPILLARY: Glucose-Capillary: 103 mg/dL — ABNORMAL HIGH (ref 70–99)

## 2013-01-04 LAB — URINALYSIS, ROUTINE W REFLEX MICROSCOPIC
Bilirubin Urine: NEGATIVE
Ketones, ur: NEGATIVE mg/dL
Nitrite: NEGATIVE
Protein, ur: NEGATIVE mg/dL
Urobilinogen, UA: 0.2 mg/dL (ref 0.0–1.0)
pH: 7 (ref 5.0–8.0)

## 2013-01-04 LAB — CBC WITH DIFFERENTIAL/PLATELET
HCT: 37.5 % (ref 36.0–46.0)
Hemoglobin: 12.8 g/dL (ref 12.0–15.0)
Lymphs Abs: 1.7 10*3/uL (ref 0.7–4.0)
Monocytes Absolute: 0.5 10*3/uL (ref 0.1–1.0)
Monocytes Relative: 7 % (ref 3–12)
Neutro Abs: 5.3 10*3/uL (ref 1.7–7.7)
Neutrophils Relative %: 70 % (ref 43–77)
RBC: 4.23 MIL/uL (ref 3.87–5.11)

## 2013-01-04 LAB — URINE MICROSCOPIC-ADD ON

## 2013-01-04 NOTE — Progress Notes (Signed)
[facility] [address], [place] [address] [phone]  Date:  01/04/2013   Name:  [name]   DOB:  Oct 03, 1961   MRN:  [id number]  PCP:  Evette Georges, MD    Chief Complaint: Hypertension, Nausea, Generalized Body Aches and Numbness   History of Present Illness:  [name] is a 52 y.o. very pleasant female patient who presents with the following:  Was sitting at the kitchen table.  She stood up to go to the bathroom as she became nauseated and went to the bathroom and became very weak and felt as though she could not control her body.  Her body was very heavy.  She lay on the bed and was unable to speak for a period of time.  Gradually she improved and was able to call her daughter who checked her blood pressure that was 170/90.  Never occurred previously.  No headache.  Non smoker.  No medications.  Patient Active Problem List  Diagnosis  . SOMATIZATION DISORDER  . UTI'S, RECURRENT  . DISTURBANCE OF SKIN SENSATION    Past Medical History  Diagnosis Date  . Hyperlipidemia   . GERD (gastroesophageal reflux disease)   . H. pylori infection   . Anal fissure     hx of   . Irritable bowel syndrome (IBS)   . Hx: UTI (urinary tract infection)     No past surgical history on file.  History  Substance Use Topics  . Smoking status: Never Smoker   . Smokeless tobacco: Never Used  . Alcohol Use: No    Family History  Problem Relation Age of Onset  . Colon cancer Neg Hx   . Diabetes Mother   . Heart disease Mother   . Stroke Mother   . Heart disease Father     Allergies  Allergen Reactions  . Penicillins Rash    Medication list has been reviewed and updated.  Current Outpatient Prescriptions on File Prior to Visit  Medication Sig Dispense Refill  . Multiple Vitamin (MULTIVITAMIN) tablet Take 1 tablet by mouth daily.         No current facility-administered medications on file prior to visit.    Review of Systems:  As per  HPI, otherwise negative.    Physical Examination: Filed Vitals:   01/04/13 1419  BP: 152/92  Pulse: 83  Temp: 98.3 F (36.8 C)  Resp: 16   Filed Vitals:   01/04/13 1419  Height: 5' 2.5" (1.588 m)  Weight: 165 lb (74.844 kg)   Body mass index is 29.68 kg/(m^2). Ideal Body Weight: Weight in (lb) to have BMI = 25: 138.6  GEN: WDWN, NAD, Non-toxic, A & O x 3 HEENT: Atraumatic, Normocephalic. Neck supple. No masses, No LAD. Ears and Nose: No external deformity. CV: RRR, No M/G/R. No JVD. No thrill. No extra heart sounds. PULM: CTA B, no wheezes, crackles, rhonchi. No retractions. No resp. distress. No accessory muscle use. ABD: S, NT, ND, +BS. No rebound. No HSM. EXTR: No c/c/e NEURO Normal gait. Gross motor and cerebellar intact.  CN 2-12 intact PRRERLA EOMI PSYCH: Normally interactive. Conversant. Not depressed or anxious appearing.  Calm demeanor.    Assessment and Plan: CVA vs TIA TO er Refused EMS to go by POV  Dareen Piano Tessa Lerner, MD

## 2013-01-04 NOTE — ED Notes (Signed)
Pt states this am she was having coffee and then nauseous. Pt then states that she could not get up, felt too "heavy" and states "my tongue felt heavy." Denies feeling "heavy" at this time. Pt states "my head feels funny but it's not a headache." Pt also states "it feels like someone is blowing air through my ears." No neuro deficits noted at this time.

## 2013-01-04 NOTE — ED Provider Notes (Signed)
History     CSN: [id number]  Arrival date & time 01/04/13  1514   First MD Initiated Contact with Patient 01/04/13 1618      Chief Complaint  Patient presents with  . Weakness    HPI Patient is a 52 year old female who presents to the emergency department with near syncope. She reports that approximately 4 hours prior to arrival she had an episode where she stood up and felt like she was going to pass out. She had to sit back down. No actual loss of consciousness. Reports after the event she felt weak for the next few hours. Now completely at baseline. No chest pain shortness of breath palpitations leg swelling or other symptoms before or after the event. No bleeding with stool. No recent vomiting or diarrhea.  Past Medical History  Diagnosis Date  . Hyperlipidemia   . GERD (gastroesophageal reflux disease)   . H. pylori infection   . Anal fissure     hx of   . Irritable bowel syndrome (IBS)   . Hx: UTI (urinary tract infection)     History reviewed. No pertinent past surgical history.  Family History  Problem Relation Age of Onset  . Colon cancer Neg Hx   . Diabetes Mother   . Heart disease Mother   . Stroke Mother   . Heart disease Father     History  Substance Use Topics  . Smoking status: Never Smoker   . Smokeless tobacco: Never Used  . Alcohol Use: No    OB History   Grav Para Term Preterm Abortions TAB SAB Ect Mult Living                  Review of Systems  Constitutional: Negative for fever and chills.  HENT: Negative for congestion, rhinorrhea, neck pain and neck stiffness.   Respiratory: Negative for cough and shortness of breath.   Cardiovascular: Negative for chest pain.  Gastrointestinal: Negative for nausea, vomiting, abdominal pain, diarrhea and abdominal distention.  Endocrine: Negative for polyuria.  Genitourinary: Negative for dysuria.  Skin: Negative for rash.  Neurological: Positive for syncope (near). Negative for headaches.   Psychiatric/Behavioral: Negative.   All other systems reviewed and are negative.    Allergies  Penicillins  Home Medications  No current outpatient prescriptions on file.  BP 170/85  Pulse 81  Temp(Src) 97.9 F (36.6 C) (Oral)  Resp 16  SpO2 99%  LMP 12/21/2012  Physical Exam  Nursing note and vitals reviewed. Constitutional: She is oriented to person, place, and time. She appears well-developed and well-nourished. No distress.  HENT:  Head: Normocephalic and atraumatic.  Right Ear: External ear normal.  Left Ear: External ear normal.  Nose: Nose normal.  Mouth/Throat: Oropharynx is clear and moist. No oropharyngeal exudate.  Eyes: EOM are normal. Pupils are equal, round, and reactive to light.  Neck: Normal range of motion. Neck supple. No tracheal deviation present.  Cardiovascular: Normal rate.   Pulmonary/Chest: Effort normal and breath sounds normal. No stridor. No respiratory distress. She has no wheezes. She has no rales.  Abdominal: Soft. She exhibits no distension. There is no tenderness. There is no rebound.  Musculoskeletal: Normal range of motion.  Neurological: She is alert and oriented to person, place, and time. She has normal strength and normal reflexes. No cranial nerve deficit or sensory deficit. She displays a negative Romberg sign. Coordination and gait normal. GCS eye subscore is 4. GCS verbal subscore is 5. GCS motor subscore is 6.  Skin: Skin is warm and dry. She is not diaphoretic.    ED Course  Procedures (including critical care time)  Labs Reviewed  COMPREHENSIVE METABOLIC PANEL - Abnormal; Notable for the following:    Sodium 133 (*)    Glucose, Bld 106 (*)    Total Bilirubin 0.2 (*)    All other components within normal limits  GLUCOSE, CAPILLARY - Abnormal; Notable for the following:    Glucose-Capillary 103 (*)    All other components within normal limits  URINALYSIS, ROUTINE W REFLEX MICROSCOPIC - Abnormal; Notable for the  following:    APPearance CLOUDY (*)    Hgb urine dipstick SMALL (*)    Leukocytes, UA MODERATE (*)    All other components within normal limits  CBC WITH DIFFERENTIAL  URINE MICROSCOPIC-ADD ON    Date: 01/04/2013  Rate: 66  Rhythm: normal sinus rhythm  QRS Axis: normal  Intervals: normal  ST/T Wave abnormalities: normal  Conduction Disutrbances:none  Narrative Interpretation: NSR.  Normal EKG.  Old EKG Reviewed: unchanged    No results found.   1. Near syncope       MDM   Jesyca Weisenburger is a 52 y.o. female who presents to the emergency department for concern of near syncope type symptoms after standing up.  Patient now asymptomatic now.  EKG WNL.  No evidence of prolonged qt, brugada, or WPW.  No anemia or evidence of GI bleed.  No aortic stenosis murmur.  Patient asymptomatic entire time here.  Already with PCP f/u for routine physical this week.  Patient discharged.      Arloa Koh, MD 01/04/13 2005

## 2013-01-05 NOTE — ED Provider Notes (Signed)
I  reviewed the resident's note and I agree with the findings and plan.     Nelia Shi, MD 01/05/13 [phone]

## 2014-06-29 ENCOUNTER — Other Ambulatory Visit: Payer: Self-pay | Admitting: Obstetrics & Gynecology

## 2014-06-30 LAB — CYTOLOGY - PAP

## 2014-10-05 ENCOUNTER — Other Ambulatory Visit: Payer: Self-pay | Admitting: Obstetrics & Gynecology

## 2014-10-06 LAB — CYTOLOGY - PAP

## 2015-07-26 ENCOUNTER — Other Ambulatory Visit: Payer: Self-pay | Admitting: Obstetrics & Gynecology

## 2015-07-27 LAB — CYTOLOGY - PAP

## 2016-10-06 HISTORY — PX: ABDOMINAL HYSTERECTOMY: SHX81

## 2016-10-23 ENCOUNTER — Other Ambulatory Visit: Payer: Self-pay | Admitting: Obstetrics & Gynecology

## 2016-12-12 ENCOUNTER — Emergency Department (HOSPITAL_COMMUNITY)
Admission: EM | Admit: 2016-12-12 | Discharge: 2016-12-12 | Disposition: A | Discharge status: Home / Self Care | Payer: Managed Care, Other (non HMO) | Attending: Emergency Medicine | Admitting: Emergency Medicine

## 2016-12-12 ENCOUNTER — Encounter (HOSPITAL_COMMUNITY): Payer: Self-pay

## 2016-12-12 DIAGNOSIS — I10 Essential (primary) hypertension: Secondary | ICD-10-CM | POA: Diagnosis present

## 2016-12-12 DIAGNOSIS — Z7982 Long term (current) use of aspirin: Secondary | ICD-10-CM | POA: Insufficient documentation

## 2016-12-12 MED ORDER — METOPROLOL TARTRATE 25 MG PO TABS: 25.0000 mg | ORAL_TABLET | Freq: Two times a day (BID) | ORAL | 0 refills | Status: DC

## 2016-12-12 NOTE — ED Provider Notes (Signed)
[facility]-EMERGENCY DEPT Provider Note   CSN: [id number] Arrival date & time: 12/12/16  0049     History   Chief Complaint Chief Complaint  Patient presents with  . Hypertension    HPI [name] is a 56 y.o. female.  HPI   Pt presents with elevated blood pressure.  States she was given two medications at her PCP appointment in [place] 4 months ago but she has not taken them.  Two days ago she had a headache and checked her blood pressure, found that it was elevated.  Since then she has been checking her blood pressure frequently and it remains elevated.  Overnight she woke up feeling nervous and worried and checked her blood pressure again.  It was 180/119.  She started searching on google and ate raw garlic and drank lemon juice and the pressure remained elevated.  She also took a baby aspirin yesterday.   Since she noticed the high blood pressure two days ago she has taken two doses of the medications she was originally prescribed - Concor Cor (Bisprolol) and captopril that goes under the tongue.  They have not helped.  Denies any CP, SOB.  States she currently has mild headache but she thinks it is from being awake all night.  Has had full workup with her PCP in [place].  Has also had workup previously for high blood pressure [facility].    Past Medical History:  Diagnosis Date  . Anal fissure    hx of   . GERD (gastroesophageal reflux disease)   . H. pylori infection   . Hx: UTI (urinary tract infection)   . Hyperlipidemia   . Irritable bowel syndrome (IBS)     Patient Active Problem List   Diagnosis Date Noted  . DISTURBANCE OF SKIN SENSATION 10/17/2007  . SOMATIZATION DISORDER 08/20/2007  . UTI'S, RECURRENT 08/20/2007    History reviewed. No pertinent surgical history.  OB History    No data available       Home Medications    Prior to Admission medications   Medication Sig Start Date End Date Taking? Authorizing Provider  aspirin 81 MG chewable tablet Chew 162  mg by mouth once.    Yes Historical Provider, MD  metoprolol (LOPRESSOR) 25 MG tablet Take 1 tablet (25 mg total) by mouth 2 (two) times daily. 12/12/16   Trixie Dredge, PA-C    Family History Family History  Problem Relation Age of Onset  . Diabetes Mother   . Heart disease Mother   . Stroke Mother   . Heart disease Father   . Colon cancer Neg Hx     Social History Social History  Substance Use Topics  . Smoking status: Never Smoker  . Smokeless tobacco: Never Used  . Alcohol use No     Allergies   Penicillins   Review of Systems Review of Systems  All other systems reviewed and are negative.    Physical Exam Updated Vital Signs BP 159/91 (BP Location: Left Arm)   Pulse 65   Temp 97.6 F (36.4 C) (Oral)   Resp 20   Ht 5\' 3"  (1.6 m)   Wt 85.3 kg   LMP 12/21/2012   SpO2 100%   BMI 33.30 kg/m   Physical Exam  Constitutional: She appears well-developed and well-nourished. No distress.  HENT:  Head: Normocephalic and atraumatic.  Neck: Neck supple.  Cardiovascular: Normal rate and regular rhythm.   Pulmonary/Chest: Effort normal and breath sounds normal. No respiratory distress.  She has no wheezes. She has no rales.  Abdominal: Soft. She exhibits no distension. There is no tenderness. There is no rebound and no guarding.  Neurological: She is alert.  Skin: She is not diaphoretic.  Nursing note and vitals reviewed.    ED Treatments / Results  Labs (all labs ordered are listed, but only abnormal results are displayed) Labs Reviewed - No data to display  EKG  EKG Interpretation  Date/Time:  Tuesday December 12 2016 01:13:22 EST Ventricular Rate:  68 PR Interval:    QRS Duration: 96 QT Interval:  402 QTC Calculation: 428 R Axis:   74 Text Interpretation:  Sinus rhythm Baseline wander in lead(s) V2 Otherwise within normal limits When compared with ECG of 01/04/2013, No significant change was found Confirmed by [facility] Surgery Center LLCGLICK  MD, DAVID ([id number]) on 12/12/2016 1:19:41  AM       Radiology No results found.  Procedures Procedures (including critical care time)  Medications Ordered in ED Medications - No data to display   Initial Impression / Assessment and Plan / ED Course  I have reviewed the triage vital signs and the nursing notes.  Pertinent labs & imaging results that were available during my care of the patient were reviewed by me and considered in my medical decision making (see chart for details).     Afebrile, nontoxic patient with known hypertension not taking her medications p/w hypertension.  She is having no CP, SOB, severe headache, AMS.  Has had full workup with PCP within the past few months.  EKG nonischemic.  Discussed pt with Dr Preston FleetingGlick.  Pt is on medications not available in this country. Plan is to change patient to medications common to this country and advise close PCP follow up.   D/C home.   Discussed result, findings, treatment, and follow up  with patient.  Pt given return precautions.  Pt verbalizes understanding and agrees with plan.       Final Clinical Impressions(s) / ED Diagnoses   Final diagnoses:  Hypertension, unspecified type    New Prescriptions Discharge Medication List as of 12/12/2016  5:24 AM    START taking these medications   Details  metoprolol (LOPRESSOR) 25 MG tablet Take 1 tablet (25 mg total) by mouth 2 (two) times daily., Starting Tue 12/12/2016, Print         SnowslipEmily Karissa Meenan, PA-C 12/12/16 [id number]    Dione Boozeavid Glick, MD 12/12/16 [phone]

## 2016-12-12 NOTE — ED Triage Notes (Signed)
Pt presents with c/o hypertension for the last couple of days. Pt denies any known hx of HTN and she does not normally taken any medications for her BP. Pt reports at the onset of the high BP, pt reports she did have a headache. Pt denies any headache at this time. Pt has Captopril pills in her pocket and said she took one of them and also took a pill called Concor Cor for her BP.

## 2016-12-12 NOTE — Discharge Instructions (Signed)
Read the information below.  Use the prescribed medication as directed.  Please discuss all new medications with your pharmacist.  You may return to the Emergency Department at any time for worsening condition or any new symptoms that concern you.     Please stop your other blood pressure medications and take this prescription twice daily as directed.  If you develop lightheadedness, chest pain, weakness, shortness of breath, return to the Emergency Department immediately.    Follow up with your primary care provider in 1 week for a recheck.

## 2016-12-13 ENCOUNTER — Ambulatory Visit (INDEPENDENT_AMBULATORY_CARE_PROVIDER_SITE_OTHER): Payer: Managed Care, Other (non HMO) | Admitting: Family Medicine

## 2016-12-13 ENCOUNTER — Encounter: Payer: Self-pay | Admitting: Family Medicine

## 2016-12-13 VITALS — BP 118/80 | HR 83 | Resp 12 | Ht 63.0 in | Wt 183.4 lb

## 2016-12-13 DIAGNOSIS — E66811 Obesity, class 1: Secondary | ICD-10-CM | POA: Insufficient documentation

## 2016-12-13 DIAGNOSIS — Z6832 Body mass index (BMI) 32.0-32.9, adult: Secondary | ICD-10-CM

## 2016-12-13 DIAGNOSIS — F411 Generalized anxiety disorder: Secondary | ICD-10-CM | POA: Diagnosis not present

## 2016-12-13 DIAGNOSIS — I10 Essential (primary) hypertension: Secondary | ICD-10-CM | POA: Diagnosis not present

## 2016-12-13 DIAGNOSIS — Z6834 Body mass index (BMI) 34.0-34.9, adult: Secondary | ICD-10-CM | POA: Insufficient documentation

## 2016-12-13 DIAGNOSIS — R002 Palpitations: Secondary | ICD-10-CM

## 2016-12-13 DIAGNOSIS — E669 Obesity, unspecified: Secondary | ICD-10-CM | POA: Insufficient documentation

## 2016-12-13 LAB — TSH: TSH: 0.71 u[IU]/mL (ref 0.35–4.50)

## 2016-12-13 LAB — T3, FREE: T3 FREE: 3.7 pg/mL (ref 2.3–4.2)

## 2016-12-13 LAB — T4, FREE: Free T4: 1.04 ng/dL (ref 0.60–1.60)

## 2016-12-13 MED ORDER — METOPROLOL TARTRATE 25 MG PO TABS: 12.5000 mg | ORAL_TABLET | Freq: Two times a day (BID) | ORAL | 0 refills | Status: DC

## 2016-12-13 NOTE — Patient Instructions (Signed)
A few things to remember from today's visit:   Hypertension, essential - Plan: TSH  Metoprolol start but 1/2 tab 2 times daily.  Blood pressure goal for most people is less than 140/90. Some populations (older than 60) the goal is less than 150/90.  Most recent cardiologists' recommendations recommend blood pressure at or less than 130/80.   Elevated blood pressure increases the risk of strokes, heart and kidney disease, and eye problems. Regular physical activity and a healthy diet (DASH diet) usually help. Low salt diet. Take medications as instructed.  Caution with some over the counter medications as cold medications, dietary products (for weight loss), and Ibuprofen or Aleve (frequent use);all these medications could cause elevation of blood pressure.   Please be sure medication list is accurate. If a new problem present, please set up appointment sooner than planned today.

## 2016-12-13 NOTE — Progress Notes (Signed)
HPI:   [name] is a 56 y.o. female, who is here today to establish care with me.  Former PCP: Dr Tawanna Cooler Last preventive routine visit: over a year ago.   Concerns today: Elevated BP. She was in the ER yesterday because her BP was elevated at 180/119. EKG was done, negative for acute ischemia and discharged on Metoprolol Tartrate 25 mg bid. She has not started medication, her BP this morning was 120/90,so planning on not taking it.   She is concerned that her BP will go up in the middle of the night and cause a MI or stroke.  She also mentions sense of feeling nervous, anxious.   Denies severe/frequent headache, visual changes, chest pain, dyspnea, claudication, focal weakness, or edema.  She has headache monthly around the time she was supposed to have her menses,S/P hysterectomy.  When lying on her back, which she has done more frequent for the past 2 months due to hysterectomy,she can feel her heart beat on upper chest and neck, no irregular. She denies tinnitus, orthopnea, PND,or apnea.  Sometimes she checks her BP q 10 min, states that it keeps going up.   She had otherwise negative cardiac work up in her home country ([place]),08/2016: Echo,Myocardial perfusion scan,and EKG. She also has had CBC,CMP, uric acid, iron, ferritin, and FLP  (08/26/16). 25 OH vit D mildly low (23.9) and all normal.  She still wonders if she needs to see cardiology, maybe "my blood is too thick."  She also wonders if she has thyroid problems. She has looked up on line and many of her symptoms suggest thyroid disease: eating all the time, fatigue,troble sleeping, wt gain, "going to the bathroom" several times per day.States that she always has had a couple of bm per day but for the past 6 months seem to be more than usual,denies loose stools,bloob in sttol.States that she feels the urge to go and has to go.  Also concerning about wt fluctuations, she weighs a few times daily and notes a  few pounds difference.   Denies abdominal pain, nausea, vomiting.  She feels "good" when she takes Aspirin 81 mg, wonders if she could continue taking it.   She doesn't exercise regularly and has not been consistent with a healthy diet.   Review of Systems  Constitutional: Positive for appetite change and fatigue. Negative for activity change and fever.  HENT: Negative for mouth sores, nosebleeds and trouble swallowing.   Eyes: Negative for redness and visual disturbance.  Respiratory: Negative for cough, shortness of breath and wheezing.   Cardiovascular: Positive for palpitations. Negative for chest pain and leg swelling.  Gastrointestinal: Positive for diarrhea. Negative for abdominal pain, nausea and vomiting.  Endocrine: Negative for cold intolerance and heat intolerance.  Genitourinary: Negative for decreased urine volume, difficulty urinating, dysuria and hematuria.  Musculoskeletal: Negative for gait problem and myalgias.  Skin: Negative for rash.  Neurological: Negative for syncope, weakness and headaches.  Hematological: Negative for adenopathy. Does not bruise/bleed easily.  Psychiatric/Behavioral: Positive for sleep disturbance. Negative for confusion. The patient is nervous/anxious.       Current Outpatient Prescriptions on File Prior to Visit  Medication Sig Dispense Refill  . aspirin 81 MG chewable tablet Chew 162 mg by mouth once.      No current facility-administered medications on file prior to visit.      Past Medical History:  Diagnosis Date  . Anal fissure    hx of   .  GERD (gastroesophageal reflux disease)   . H. pylori infection   . Hx: UTI (urinary tract infection)   . Hyperlipidemia   . Hypertension   . Irritable bowel syndrome (IBS)    Allergies  Allergen Reactions  . Penicillins Rash    Has patient had a PCN reaction causing immediate rash, facial/tongue/throat swelling, SOB or lightheadedness with hypotension: No Has patient had a PCN  reaction causing severe rash involving mucus membranes or skin necrosis: No Has patient had a PCN reaction that required hospitalization No Has patient had a PCN reaction occurring within the last 10 years: No If all of the above answers are "NO", then may proceed with Cephalosporin use.     Family History  Problem Relation Age of Onset  . Diabetes Mother   . Heart disease Mother   . Stroke Mother   . Hypertension Mother   . Heart disease Father   . Colon cancer Neg Hx     Social History   Social History  . Marital status: Married    Spouse name: N/A  . Number of children: 3  . Years of education: N/A   Social History Main Topics  . Smoking status: Never Smoker  . Smokeless tobacco: Never Used  . Alcohol use No  . Drug use: No  . Sexual activity: Yes   Other Topics Concern  . None   Social History Narrative   Caffeine daily     Vitals:   12/13/16 1001  BP: 118/80  Pulse: 83  Resp: 12   O2 sat 97% at RA. Body mass index is 32.48 kg/m.   Physical Exam  Nursing note and vitals reviewed. Constitutional: She is oriented to person, place, and time. She appears well-developed. No distress.  HENT:  Head: Atraumatic.  Mouth/Throat: Oropharynx is clear and moist and mucous membranes are normal.  Eyes: Conjunctivae and EOM are normal. Pupils are equal, round, and reactive to light.  Neck: No JVD present. Carotid bruit is not present. No tracheal deviation present. No thyroid mass and no thyromegaly present.  Cardiovascular: Normal rate and regular rhythm.   No murmur heard. Respiratory: Effort normal and breath sounds normal. No respiratory distress.  GI: Soft. She exhibits no mass. There is no hepatomegaly. There is no tenderness.  Musculoskeletal: She exhibits no edema.  Lymphadenopathy:    She has no cervical adenopathy.  Neurological: She is alert and oriented to person, place, and time. She has normal strength. Coordination normal.  Skin: Skin is warm. No  erythema.  Psychiatric: Her mood appears anxious.  Well groomed, good eye contact.      ASSESSMENT AND PLAN:   [name] was seen today for establish care.  Diagnoses and all orders for this visit:  Heart palpitations  We discussed possible causes, she has had echo and stress test done, otherwise negative. ? Anxiety. She could start Metoprolol but lower dose, some side effects discussed. Instructed about warning signs.  -     metoprolol tartrate (LOPRESSOR) 25 MG tablet; Take 0.5 tablets (12.5 mg total) by mouth 2 (two) times daily.  Hypertension, essential  She is still wondering why he BP is elevated,educated about criteria Dx and etiology.   BP here in the office today adequately controlled. We discussed treatment options, Metoprolol also may help with anxiety and palpitations. Start Metoprolol 12.5 mg bid. Low salt diet. Monitoring BP at home can cause anxiety, she can do it once daily. F/U in 3-4 weeks.  TSH was ordered, she requested  adding T4 and T3 when she was in the lab.   -     TSH -     metoprolol tartrate (LOPRESSOR) 25 MG tablet; Take 0.5 tablets (12.5 mg total) by mouth 2 (two) times daily. -     T4, free -     T3, free  BMI 32.0-32.9,adult  We discussed benefits of wt loss as well as adverse effects of obesity. Consistency with healthy diet and physical activity recommended. Daily brisk walking for 15-30 min as tolerated.  -     T4, free -     T3, free  GAD (generalized anxiety disorder)  She is very anxious, recommend trying a low dose SSRI but she is not interested in treatment.  -     T4, free -     T3, free   Educated about Aspirin benefits for primary prevention, side effects also discussed.   Delaine Canter G. [place], MD  [facility]. [place] office.

## 2016-12-13 NOTE — Progress Notes (Signed)
Pre visit review using our clinic review tool, if applicable. No additional management support is needed unless otherwise documented below in the visit note. 

## 2016-12-26 ENCOUNTER — Encounter: Payer: Self-pay | Admitting: Family Medicine

## 2016-12-26 ENCOUNTER — Ambulatory Visit (INDEPENDENT_AMBULATORY_CARE_PROVIDER_SITE_OTHER): Payer: Managed Care, Other (non HMO) | Admitting: Family Medicine

## 2016-12-26 VITALS — BP 140/80 | HR 81 | Resp 12 | Ht 63.0 in | Wt 182.4 lb

## 2016-12-26 DIAGNOSIS — R0789 Other chest pain: Secondary | ICD-10-CM

## 2016-12-26 DIAGNOSIS — R400 Somnolence: Secondary | ICD-10-CM | POA: Diagnosis not present

## 2016-12-26 DIAGNOSIS — I10 Essential (primary) hypertension: Secondary | ICD-10-CM

## 2016-12-26 DIAGNOSIS — K219 Gastro-esophageal reflux disease without esophagitis: Secondary | ICD-10-CM | POA: Diagnosis not present

## 2016-12-26 DIAGNOSIS — F411 Generalized anxiety disorder: Secondary | ICD-10-CM

## 2016-12-26 LAB — BASIC METABOLIC PANEL
BUN: 15 mg/dL (ref 6–23)
CHLORIDE: 101 meq/L (ref 96–112)
CO2: 32 mEq/L (ref 19–32)
Calcium: 9.7 mg/dL (ref 8.4–10.5)
Creatinine, Ser: 0.79 mg/dL (ref 0.40–1.20)
GFR: 80.79 mL/min (ref >60.00)
Glucose, Bld: 96 mg/dL (ref 70–99)
POTASSIUM: 4.1 meq/L (ref 3.5–5.1)
SODIUM: 137 meq/L (ref 135–145)

## 2016-12-26 MED ORDER — OMEPRAZOLE 20 MG PO CPDR: 20.0000 mg | DELAYED_RELEASE_CAPSULE | Freq: Every day | ORAL | 3 refills | Status: DC

## 2016-12-26 NOTE — Progress Notes (Signed)
[name] is a 56 y.o.female, who is here today to follow on HTN.  I saw her on 12/13/16, when she was very concerned about elevated BP among others. She had a Rx for Metoprolol given after recent ER visit, which she had not started, she agreed with taking 1/2 tab bid.  She is still checking BP at home and some readings elevated, 170/105 max reading. 140/95,130/91,127/94.  She has several concerns today: Blurry vision sometimes, "heart working hard", headache,faigue, ears feel like they in fire,dry mouth in the morning, throal discomfort, among some.  She increased Metoprolol dose to 1 tab at night dose because AM DBP's still elevated. She has not noted exertional chest pain, dyspnea,  focal weakness, or edema. + left-sided chest discomfort, states that "is is not pain" but feels like her "heart is working hard","heart pain." Symptoms are usually at night when she is in bed, she feels "strianged", anxious, afraid she is going to have a heart attack, she tells me that she does not want to die, "I am getting very crazy and I do not want to die."  She [place] cardiac [place], negative otherwise and includes echo,stress test, blood work among some.    Lab Results  Component Value Date   CREATININE 0.53 01/04/2013   BUN 7 01/04/2013   NA 133 (L) 01/04/2013   K 3.6 01/04/2013   CL 97 01/04/2013   CO2 28 01/04/2013   She wonders if she needs a sleep study, she wakes up in the morning with throat burning sensation and dry mouth. + Fatigue, does not feel rested, "little" frontal headache, R>L, intermittently.Headache lasts about 1.5 hours and resolves once she is "calmed." She feels better, "relaxed" after she takes Metoprolol.  She has Hx of GERD, she has not noted heartburn or abdominal pain. EGD 10/2005 :mild esophagitis and gastritis, acid reflux.She was on Omeprazole years ago and "really helped."    Review of Systems  Constitutional: Positive for fatigue. Negative  for appetite change, diaphoresis, fever and unexpected weight change.  HENT: Positive for congestion and sore throat. Negative for mouth sores, nosebleeds, postnasal drip, trouble swallowing and voice change.   Eyes: Positive for visual disturbance. Negative for pain and redness.  Respiratory: Negative for cough, shortness of breath and wheezing.   Cardiovascular: Positive for palpitations. Negative for leg swelling.  Gastrointestinal: Negative for abdominal pain, nausea and vomiting.       Negative for changes in bowel habits.  Genitourinary: Negative for decreased urine volume and hematuria.  Musculoskeletal: Negative for gait problem and myalgias.  Skin: Negative for rash.  Neurological: Positive for headaches. Negative for syncope, facial asymmetry and weakness.  Hematological: Negative for adenopathy. Does not bruise/bleed easily.  Psychiatric/Behavioral: Positive for sleep disturbance. Negative for confusion. The patient is nervous/anxious.      Current Outpatient Prescriptions on File Prior to Visit  Medication Sig Dispense Refill  . aspirin 81 MG chewable tablet Chew 162 mg by mouth once.     . metoprolol tartrate (LOPRESSOR) 25 MG tablet Take 0.5 tablets (12.5 mg total) by mouth 2 (two) times daily. 60 tablet 0   No current facility-administered medications on file prior to visit.      Past Medical History:  Diagnosis Date  . Anal fissure    hx of   . GERD (gastroesophageal reflux disease)   . H. pylori infection   . Hx: UTI (urinary tract infection)   . Hyperlipidemia   . Hypertension   .  Irritable bowel syndrome (IBS)     Allergies  Allergen Reactions  . Penicillins Rash    Has patient had a PCN reaction causing immediate rash, facial/tongue/throat swelling, SOB or lightheadedness with hypotension: No Has patient had a PCN reaction causing severe rash involving mucus membranes or skin necrosis: No Has patient had a PCN reaction that required hospitalization  No Has patient had a PCN reaction occurring within the last 10 years: No If all of the above answers are "NO", then may proceed with Cephalosporin use.     Social History   Social History  . Marital status: Married    Spouse name: N/A  . Number of children: 3  . Years of education: N/A   Social History Main Topics  . Smoking status: Never Smoker  . Smokeless tobacco: Never Used  . Alcohol use No  . Drug use: No  . Sexual activity: Yes   Other Topics Concern  . None   Social History Narrative   Caffeine daily     Vitals:   12/26/16 1358  BP: 140/80  Pulse: 81  Resp: 12  O2 sat 98% at RA. Body mass index is 32.31 kg/m.   Physical Exam  Nursing note and vitals reviewed. Constitutional: She is oriented to person, place, and time. She appears well-developed. No distress.  HENT:  Head: Atraumatic.  Mouth/Throat: Oropharynx is clear and moist and mucous membranes are normal.  Eyes: Conjunctivae and EOM are normal. Pupils are equal, round, and reactive to light.  Neck: No JVD present.  Cardiovascular: Normal rate and regular rhythm.   No murmur heard. Pulses:      Dorsalis pedis pulses are 2+ on the right side, and 2+ on the left side.  Respiratory: Effort normal and breath sounds normal. No respiratory distress. She exhibits no tenderness.  GI: Soft. She exhibits no mass. There is no hepatomegaly. There is no tenderness.  Musculoskeletal: She exhibits no edema or tenderness.  Lymphadenopathy:    She has no cervical adenopathy.  Neurological: She is alert and oriented to person, place, and time. She has normal strength. No cranial nerve deficit. Coordination and gait normal.  Skin: Skin is warm. No rash noted. No erythema.  Psychiatric: Her mood appears anxious.  Well groomed, good eye contact.    ASSESSMENT AND PLAN:   [name] was seen today for follow-up.  Diagnoses and all orders for this visit:  Chest discomfort  Possible etiologies dicussed, she has had  cardiac work-up in [place] last year and otherwise negative. Instructed about warning signs.  -     Ambulatory referral to Cardiology  Gastroesophageal reflux disease, esophagitis presence not specified  Could be contributing to some of her complaints. GERD precautions discussed. She agrees with trying low dose PPI, some side effects discussed. F/U in 2 months.   -     omeprazole (PRILOSEC) 20 MG capsule; Take 1 capsule (20 mg total) by mouth daily.  Hypertension, essential  Could be aggravated by anxiety. She is afraid of side effects from elevated BP but also afraid of antihypertensive medication.She is reluctant to increasing Metoprolol dose, side effects discussed. I asked ehr to consider increasing at least current dose by 12.5 mg, 25 mg bid is still a low dose.  F/U in 2 months.  -     Ambulatory referral to Cardiology -     Basic metabolic panel  GAD (generalized anxiety disorder)  I think this is aggravating other above problems. She is not interested in adding  SSRI. Psychotherapy may help. BB may help some.  Daytime somnolence  Explained that dry mouth and throat discomfort could be related to mouth breathing and/oir GERD symptoms. I still think sleep study is appropriate,referral placed.  -     Ambulatory referral to Pulmonology      -Ms. Brit Carbonell was advised to return sooner than planned today if new concerns arise.     Derral Colucci G. [place], MD  [facility]. [place] office.

## 2016-12-26 NOTE — Patient Instructions (Signed)
A few things to remember from today's visit:   Hypertension, essential - Plan: Ambulatory referral to Cardiology, Basic metabolic panel  Gastroesophageal reflux disease, esophagitis presence not specified - Plan: omeprazole (PRILOSEC) 20 MG capsule  GAD (generalized anxiety disorder)  Daytime somnolence - Plan: Ambulatory referral to Pulmonology  Blood pressure goal for most people is less than 140/90. Some populations (older than 60) the goal is less than 150/90.  Most recent cardiologists' recommendations recommend blood pressure at or less than 130/80.  Metoprolol could be increased by 1/2 tab. Monior pulse.  Elevated blood pressure increases the risk of strokes, heart and kidney disease, and eye problems. Regular physical activity and a healthy diet (DASH diet) usually help. Low salt diet. Take medications as instructed.  Caution with some over the counter medications as cold medications, dietary products (for weight loss), and Ibuprofen or Aleve (frequent use);all these medications could cause elevation of blood pressure.   GERD:   Avoid foods that make your symptoms worse, for example coffee, chocolate,pepermeint,alcohol, and greasy food. Raising the head of your bed about 6 inches may help with nocturnal symptoms.  Avoid tobacco use. Weight loss (if you are overweight). Avoid lying down for 3 hours after eating.  Instead 3 large meals daily try small and more frequent meals during the day.  Every medication have side effects and medications for GERD are not the exception.At this time I think benefit is greater than risk.  There has been some concerns about dementia and medications like Omeprazole or Nexium (PPI) but recent studies do not show a relation. Also kidney function can be affected among some patients that take these type of medications, we will follow accordingly. Taking these medications for long term could increase risk of osteoporosis (some debate now),  vitamin deficiencies (Vit D and B12 specialty), increases risk of pneumonia.  You should be evaluated immediately if bloody vomiting, bloody stools, black stools (like tar), difficulty swallowing, food gets stuck on the way down or choking when eating. Abnormal weight loss or severe abdominal pain.  If symptoms are not resolved sometimes endoscopy is necessary.  Please be sure medication list is accurate. If a new problem present, please set up appointment sooner than planned today.

## 2016-12-26 NOTE — Progress Notes (Signed)
Pre visit review using our clinic review tool, if applicable. No additional management support is needed unless otherwise documented below in the visit note. 

## 2017-01-05 ENCOUNTER — Ambulatory Visit (INDEPENDENT_AMBULATORY_CARE_PROVIDER_SITE_OTHER): Payer: Managed Care, Other (non HMO) | Admitting: Cardiovascular Disease

## 2017-01-05 ENCOUNTER — Encounter: Payer: Self-pay | Admitting: Cardiovascular Disease

## 2017-01-05 VITALS — BP 120/80 | HR 61 | Ht 63.0 in | Wt 182.2 lb

## 2017-01-05 DIAGNOSIS — E785 Hyperlipidemia, unspecified: Secondary | ICD-10-CM | POA: Diagnosis not present

## 2017-01-05 DIAGNOSIS — R0789 Other chest pain: Secondary | ICD-10-CM

## 2017-01-05 DIAGNOSIS — I10 Essential (primary) hypertension: Secondary | ICD-10-CM | POA: Diagnosis not present

## 2017-01-05 MED ORDER — LISINOPRIL 5 MG PO TABS: 5.0000 mg | ORAL_TABLET | Freq: Every day | ORAL | 3 refills | Status: DC

## 2017-01-05 NOTE — Assessment & Plan Note (Signed)
[name] has atypical chest pain. She does have her factors including family history and hypertension. The pain is left precordial radiating to her left arm. She does have significant component of anxiety as well. I'm going to get a pharmacologic Myoview stress test to risk stratify her.

## 2017-01-05 NOTE — Patient Instructions (Signed)
Medication Instructions: START Lisinopril 5 mg daily.   Labwork: Your physician recommends that you return for lab work in: 3 weeks--BMET, Lipid/Liver (fasting)   Testing/Procedures: Your physician has requested that you have a lexiscan myoview. For further information please visit [email or web address].    Follow-Up: Your physician recommends that you schedule a follow-up appointment in: 1 month with PharmD in [facility] for BP Check. Your physician has requested that you regularly monitor and record your blood pressure readings at home. Please use the same machine at the same time of day to check your readings and record them to bring to your follow-up visit.  Your physician recommends that you schedule a follow-up appointment in: 3 months with Dr. Allyson SabalBerry after testing is completed.  If you need a refill on your cardiac medications before your next appointment, please call your pharmacy.

## 2017-01-05 NOTE — Progress Notes (Signed)
01/05/2017 [name]   08-06-1961  [id number]  Primary Physician Betty [place], MD Primary Cardiologist: Runell Gess MD Roseanne Reno  HPI:  [name] is a 56 year old moderately overweight married [place] female mother of 3 daughters referred by Dr. Betty [place] for cardiovascular evaluation because of hypertension and atypical chest pain. She moved to the [place] 35 years ago. She works at [employer] where she is exposed to chemicals and lifts heavy objects. Her cardiac risk factor profile is notable only for hypertension and family history. She says both of her parents had "stents". She has never had a heart attack or stroke. He does get occasional atypical chest pain. She's had a negative Myoview stress test remotely in [place]. She is fairly anxious and tearful today in the office. She is worried that she is "going to die". I have reassured her. I reviewed her blood pressure log and the most part her blood pressure is well controlled except for some isolated elevated diastolic blood pressures.   Current Outpatient Prescriptions  Medication Sig Dispense Refill  . aspirin 81 MG chewable tablet Chew 162 mg by mouth once.     . metoprolol tartrate (LOPRESSOR) 25 MG tablet Take 25 mg by mouth 2 (two) times daily. Take an extra 1/2 tablet daily as needed.    Marland Kitchen omeprazole (PRILOSEC) 20 MG capsule Take 1 capsule (20 mg total) by mouth daily. 30 capsule 3   No current facility-administered medications for this visit.     Allergies  Allergen Reactions  . Penicillins Rash    Has patient had a PCN reaction causing immediate rash, facial/tongue/throat swelling, SOB or lightheadedness with hypotension: No Has patient had a PCN reaction causing severe rash involving mucus membranes or skin necrosis: No Has patient had a PCN reaction that required hospitalization No Has patient had a PCN reaction occurring within the last 10 years: No If all of the above answers are "NO", then  may proceed with Cephalosporin use.     Social History   Social History  . Marital status: Married    Spouse name: N/A  . Number of children: 3  . Years of education: N/A   Occupational History  . Not on file.   Social History Main Topics  . Smoking status: Never Smoker  . Smokeless tobacco: Never Used  . Alcohol use No  . Drug use: No  . Sexual activity: Yes   Other Topics Concern  . Not on file   Social History Narrative   Caffeine daily      Review of Systems: General: negative for chills, fever, night sweats or weight changes.  Cardiovascular: negative for chest pain, dyspnea on exertion, edema, orthopnea, palpitations, paroxysmal nocturnal dyspnea or shortness of breath Dermatological: negative for rash Respiratory: negative for cough or wheezing Urologic: negative for hematuria Abdominal: negative for nausea, vomiting, diarrhea, bright red blood per rectum, melena, or hematemesis Neurologic: negative for visual changes, syncope, or dizziness All other systems reviewed and are otherwise negative except as noted above.    Blood pressure 120/80, pulse 61, height 5\' 3"  (1.6 m), weight 182 lb 3.2 oz (82.6 kg), last menstrual period 12/21/2012.  General appearance: alert and no distress Neck: no adenopathy, no carotid bruit, no JVD, supple, symmetrical, trachea midline and thyroid not enlarged, symmetric, no tenderness/mass/nodules Lungs: clear to auscultation bilaterally Heart: regular rate and rhythm, S1, S2 normal, no murmur, click, rub or gallop Extremities: extremities normal, atraumatic, no cyanosis or edema  EKG not performed today  ASSESSMENT AND PLAN:   Atypical chest pain [name] has atypical chest pain. She does have her factors including family history and hypertension. The pain is left precordial radiating to her left arm. She does have significant component of anxiety as well. I'm going to get a pharmacologic Myoview stress test to risk stratify  her.  Hypertension, essential History of hypertension on metoprolol twice a day. She did bring up blood pressure log that shows some diastolic blood pressures in the high 80s and low 90s. She says that when he gets in the 100 and she is symptomatic. I'm going to begin her on lisinopril 5 mg a day in addition to her beta blocker. We will check a bit basic metabolic panel in 3 weeks. She'll keep a blood pressure log and will see [name] back in the office 1 month for further evaluation.      Runell GessJonathan J. Berry MD FACP,FACC,FAHA, [facility] 01/05/2017 10:51 AM

## 2017-01-05 NOTE — Assessment & Plan Note (Signed)
History of hypertension on metoprolol twice a day. She did bring up blood pressure log that shows some diastolic blood pressures in the high 80s and low 90s. She says that when he gets in the 100 and she is symptomatic. I'm going to begin her on lisinopril 5 mg a day in addition to her beta blocker. We will check a bit basic metabolic panel in 3 weeks. She'll keep a blood pressure log and will see Belenda CruiseKristin back in the office 1 month for further evaluation.

## 2017-01-09 ENCOUNTER — Telehealth (HOSPITAL_COMMUNITY): Payer: Self-pay

## 2017-01-09 NOTE — Telephone Encounter (Signed)
Encounter complete. 

## 2017-01-10 ENCOUNTER — Telehealth: Payer: Self-pay | Admitting: Family Medicine

## 2017-01-10 ENCOUNTER — Encounter (HOSPITAL_COMMUNITY): Payer: Self-pay | Admitting: *Deleted

## 2017-01-10 ENCOUNTER — Ambulatory Visit (HOSPITAL_COMMUNITY)
Admission: RE | Admit: 2017-01-10 | Discharge: 2017-01-10 | Disposition: A | Discharge status: Home / Self Care | Payer: Managed Care, Other (non HMO) | Source: Ambulatory Visit | Attending: Cardiovascular Disease | Admitting: Cardiovascular Disease

## 2017-01-10 DIAGNOSIS — Z6832 Body mass index (BMI) 32.0-32.9, adult: Secondary | ICD-10-CM | POA: Insufficient documentation

## 2017-01-10 DIAGNOSIS — R0789 Other chest pain: Secondary | ICD-10-CM | POA: Insufficient documentation

## 2017-01-10 DIAGNOSIS — E669 Obesity, unspecified: Secondary | ICD-10-CM | POA: Insufficient documentation

## 2017-01-10 DIAGNOSIS — Z8249 Family history of ischemic heart disease and other diseases of the circulatory system: Secondary | ICD-10-CM | POA: Insufficient documentation

## 2017-01-10 DIAGNOSIS — I10 Essential (primary) hypertension: Secondary | ICD-10-CM | POA: Insufficient documentation

## 2017-01-10 LAB — MYOCARDIAL PERFUSION IMAGING
CHL CUP NUCLEAR SRS: 0
CHL CUP NUCLEAR SSS: 1
LV dias vol: 86 mL (ref 46–106)
LVSYSVOL: 31 mL
Peak HR: 98 {beats}/min
Rest HR: 62 {beats}/min
SDS: 1
TID: 1.05

## 2017-01-10 MED ORDER — TECHNETIUM TC 99M TETROFOSMIN IV KIT
32.4000 | PACK | Freq: Once | INTRAVENOUS | Status: AC | PRN
  Administered 2017-01-10: 32.4 via INTRAVENOUS
  Filled 2017-01-10: qty 33

## 2017-01-10 MED ORDER — TECHNETIUM TC 99M TETROFOSMIN IV KIT
10.9000 | PACK | Freq: Once | INTRAVENOUS | Status: AC | PRN
  Administered 2017-01-10: 10.9 via INTRAVENOUS
  Filled 2017-01-10: qty 11

## 2017-01-10 MED ORDER — REGADENOSON 0.4 MG/5ML IV SOLN
0.4000 mg | Freq: Once | INTRAVENOUS | Status: AC
  Administered 2017-01-10: 0.4 mg via INTRAVENOUS

## 2017-01-10 NOTE — Telephone Encounter (Signed)
Patient requesting RX refill on metoprolol tartrate (LOPRESSOR) 25 MG tablet. Patient states that she only 3 days left.  Contact Info: [phone]

## 2017-01-10 NOTE — Progress Notes (Signed)
Pt had c/o CP post stress imaging. Dr Tresa EndoKelly reviewed Myoview study. Ok d/c pt home.

## 2017-01-11 ENCOUNTER — Telehealth: Payer: Self-pay | Admitting: Cardiovascular Disease

## 2017-01-11 MED ORDER — METOPROLOL TARTRATE 25 MG PO TABS: 25.0000 mg | ORAL_TABLET | Freq: Two times a day (BID) | ORAL | 3 refills | Status: DC

## 2017-01-11 NOTE — Telephone Encounter (Signed)
Rx sent 

## 2017-01-11 NOTE — Telephone Encounter (Signed)
Myocardial Perfusion Imaging  Order: [id number]  Status:  Final result Visible to patient:  No (Not Released) Dx:  Hypertension, essential; Atypical che...  Notes Recorded by Evans Lanceaylor W Summar Mcglothlin on 01/11/2017 at 9:20 AM EST Left detail message with results, ok per DPR, and to call back if any questions.   ------  Notes Recorded by Runell GessJonathan J Berry, MD on 01/11/2017 at 8:34 AM EST Essentially normal study. Repeat when clinically indicated.

## 2017-01-14 NOTE — Progress Notes (Deleted)
     HPI:   ACUTE VISIT:  No chief complaint on file.   Ms.Nayellie Farrell OursRezvani is a 56 y.o. female, who is here today complaining of elevated BP. I saw her last on 12/26/16,since then she has seen cardiologists, Dr Allyson SabalBerry. She has stress test on 01/10/17, negative.  She has had cardiac work-up done in [place],08/2016: stress test,echo, as well as lab work.  She is on Metoprolol Tartrate 25 mg and Lisinopril 5 mg.  Denies severe/frequent headache, visual changes, dyspnea, palpitation, claudication, focal weakness, or edema. ***  GERD: Last OV she agreed with Omeprazole 20 mg, thinking that her chest discomfort may be related to GERD symptoms.   Denies abdominal pain, nausea, vomiting, changes in bowel habits, blood in stool or melena.  + Fatigue.      Review of Systems    Current Outpatient Prescriptions on File Prior to Visit  Medication Sig Dispense Refill  . aspirin 81 MG chewable tablet Chew 162 mg by mouth once.     Marland Kitchen. lisinopril (PRINIVIL,ZESTRIL) 5 MG tablet Take 1 tablet (5 mg total) by mouth daily. 90 tablet 3  . metoprolol tartrate (LOPRESSOR) 25 MG tablet Take 1 tablet (25 mg total) by mouth 2 (two) times daily. Take an extra 1/2 tablet daily as needed. 60 tablet 3  . omeprazole (PRILOSEC) 20 MG capsule Take 1 capsule (20 mg total) by mouth daily. 30 capsule 3   No current facility-administered medications on file prior to visit.      Past Medical History:  Diagnosis Date  . Anal fissure    hx of   . GERD (gastroesophageal reflux disease)   . H. pylori infection   . Hx: UTI (urinary tract infection)   . Hyperlipidemia   . Hypertension   . Irritable bowel syndrome (IBS)    Allergies  Allergen Reactions  . Penicillins Rash    Has patient had a PCN reaction causing immediate rash, facial/tongue/throat swelling, SOB or lightheadedness with hypotension: No Has patient had a PCN reaction causing severe rash involving mucus membranes or skin necrosis: No Has  patient had a PCN reaction that required hospitalization No Has patient had a PCN reaction occurring within the last 10 years: No If all of the above answers are "NO", then may proceed with Cephalosporin use.     Social History   Social History  . Marital status: Married    Spouse name: N/A  . Number of children: 3  . Years of education: N/A   Social History Main Topics  . Smoking status: Never Smoker  . Smokeless tobacco: Never Used  . Alcohol use No  . Drug use: No  . Sexual activity: Yes   Other Topics Concern  . Not on file   Social History Narrative   Caffeine daily     There were no vitals filed for this visit. There is no height or weight on file to calculate BMI.      Physical Exam    ASSESSMENT AND PLAN:     There are no diagnoses linked to this encounter.      No Follow-up on file.     -Ms.Kipp BroodZahra Mcelhannon advised to return or notify a doctor immediately if symptoms worsen or persist or new concerns arise.       Maresa Morash G. [place], MD  [facility]. [place] office.

## 2017-01-15 ENCOUNTER — Ambulatory Visit: Payer: Managed Care, Other (non HMO) | Admitting: Family Medicine

## 2017-01-15 ENCOUNTER — Telehealth: Payer: Self-pay | Admitting: *Deleted

## 2017-01-15 NOTE — Telephone Encounter (Signed)
Patient walked into office wanting to discuss blood pressure; patient stated her bp was 166/101 a few days ago; patient states she feels like her bp is high again today; bp checked, results 148/92; patient also concerned because she isn't sleeping at night because she is so worried about blood pressure. Patient has bp log with her; patient checks bp multiple times during the day, some days greater than 10 times with only one abnormal reading noted. Patient denies headache, chestpain, or dizziness. Reviewed with Dr. SwazilandJordan who requests patient to come in for blood pressure follow up on 01/15/17; instructed patient on s/s of MI and Stroke, also educated on relaxation techniques and ways to reduce anxiety, understanding voiced; appointment made for follow up with MD on 01/15/17

## 2017-01-26 LAB — BASIC METABOLIC PANEL WITH GFR
BUN: 14 mg/dL (ref 7–25)
CALCIUM: 9.2 mg/dL (ref 8.6–10.4)
CO2: 32 mmol/L — ABNORMAL HIGH (ref 20–31)
Chloride: 104 mmol/L (ref 98–110)
Creat: 0.63 mg/dL (ref 0.50–1.05)
GLUCOSE: 82 mg/dL (ref 65–99)
POTASSIUM: 3.9 mmol/L (ref 3.5–5.3)
SODIUM: 139 mmol/L (ref 135–146)

## 2017-01-26 LAB — HEPATIC FUNCTION PANEL
ALT: 17 U/L (ref 6–29)
AST: 16 U/L (ref 10–35)
Albumin: 3.9 g/dL (ref 3.6–5.1)
Alkaline Phosphatase: 66 U/L (ref 33–130)
Bilirubin, Direct: 0.1 mg/dL (ref <=0.2)
Indirect Bilirubin: 0.6 mg/dL (ref 0.2–1.2)
TOTAL PROTEIN: 6.6 g/dL (ref 6.1–8.1)
Total Bilirubin: 0.7 mg/dL (ref 0.2–1.2)

## 2017-01-26 LAB — LIPID PANEL
CHOL/HDL RATIO: 3.2 ratio (ref <5.0)
Cholesterol: 165 mg/dL (ref <200)
HDL: 52 mg/dL (ref >50)
LDL CALC: 101 mg/dL — AB (ref <100)
TRIGLYCERIDES: 58 mg/dL (ref <150)
VLDL: 12 mg/dL (ref <30)

## 2017-01-31 ENCOUNTER — Encounter: Payer: Self-pay | Admitting: Cardiovascular Disease

## 2017-02-08 ENCOUNTER — Ambulatory Visit (INDEPENDENT_AMBULATORY_CARE_PROVIDER_SITE_OTHER): Payer: Managed Care, Other (non HMO) | Admitting: Pharmacist

## 2017-02-08 VITALS — BP 140/78 | HR 66

## 2017-02-08 DIAGNOSIS — I10 Essential (primary) hypertension: Secondary | ICD-10-CM | POA: Diagnosis not present

## 2017-02-08 NOTE — Progress Notes (Signed)
Patient ID: [name]                 DOB: 05-02-1961                      MRN: [id number]     HPI: [name] is a 56 y.o. female referred by Dr. Allyson Sabal to HTN clinic. PMH includes hypertension and atypical chest pain.  Noted patient was initiated on bisoprolol daily and captopril  ODT daily as needed by her physician while in [place] but she stopped taking both medication few months ago.  On 12/12/16 patient visited Urgent Care for uncontrolled hypertension and metoprolol tartrate  twice daily was initiated.  During cardiologist office visit on 01/05/17 Lisinopril  was added to therapy per [name] request.  Patient presents today for HTN follow up. Denies chest pain, headaches, swelling or fatigue.  Reports some lightheadedness when taking metoprolol and Lisinopril; therefore, patient self-adjust her therapy to lisinopril  daily in the mornings and metoprolol  in the evenings.  She also stopped monitoring her BP more than 3 weeks ago per PCP instructions. Since stopping BP monitoring anxiety levels had dropped significantly, no chest pressure or discomfort noted, and no other symptoms to report either.  Appointment for sleep apnea assessment already scheduled, and patient is currently more focused on lifestyle modifications.  She also expressed interest on stopping ALL medication if possible.  Current HTN meds:  Metoprolol tartrate  daily (taking only evening dose) Lisinopril  daily in the morning Captopril 70m ODT as needed (per MD in [place])  Previously tried:  Bisoprolol (patient stopped taking; no ADR)  BP goal: <130/80  Family History: mother has high blood pressure controlled with medicaiton  Social History:  Denies smoking, smokeless tobacco, alcohol use or any other drugs  Diet: decrease salt in diet, no caffeine, low fat diet  Exercise: active at work , no other exersice at this time.  Home BP readings:  Last reading available 01/12/2017 22 readings;  136/91 average (range 114-166/70-110) **Patient Home device calibrated and determined to be INACCURATE by > in diastolic measure**  Wt Readings from Last 3 Encounters:  01/10/17 182 lb (82.6 kg)  01/05/17 182 lb 3.2 oz (82.6 kg)  12/26/16 182 lb 6 oz (82.7 kg)   BP Readings from Last 3 Encounters:  02/08/17 140/78  01/05/17 120/80  12/26/16 140/80   Pulse Readings from Last 3 Encounters:  02/08/17 66  01/05/17 61  12/26/16 81    Past Medical History:  Diagnosis Date  . Anal fissure    hx of   . GERD (gastroesophageal reflux disease)   . H. pylori infection   . Hx: UTI (urinary tract infection)   . Hyperlipidemia   . Hypertension   . Irritable bowel syndrome (IBS)     Current Outpatient Prescriptions on File Prior to Visit  Medication Sig Dispense Refill  . aspirin 81 MG chewable tablet Chew 162 mg by mouth once.     Marland Kitchen lisinopril (PRINIVIL,ZESTRIL) 5 MG tablet Take 1 tablet (5 mg total) by mouth daily. 90 tablet 3  . metoprolol tartrate (LOPRESSOR) 25 MG tablet Take 1 tablet (25 mg total) by mouth 2 (two) times daily. Take an extra 1/2 tablet daily as needed. 60 tablet 3  . omeprazole (PRILOSEC) 20 MG capsule Take 1 capsule (20 mg total) by mouth daily. 30 capsule 3   No current facility-administered medications on file prior to visit.     Allergies  Allergen Reactions  . Penicillins Rash    Has patient had a PCN reaction causing immediate rash, facial/tongue/throat swelling, SOB or lightheadedness with hypotension: No Has patient had a PCN reaction causing severe rash involving mucus membranes or skin necrosis: No Has patient had a PCN reaction that required hospitalization No Has patient had a PCN reaction occurring within the last 10 years: No If all of the above answers are "NO", then may proceed with Cephalosporin use.     Blood pressure 140/78, pulse 66, last menstrual period 12/21/2012, SpO2 98 %.  Hypertension:   Blood pressure today remains  slightly above goal but patient verbalized been very anxious about BP check and visit.  No home readings available since patient initiated regiment of metoprolol tartrate  every evening and lisinopril  every morning.  Significant amount of time (45 minutes) spent talking about lifestyle modifications, answering questions about hypertension disease in general and taking to patient about compliance with MD instructions.  Her BP home device is NOT accurate and her cuff is too small for her.  Will discontinue metoprolol  due to lack of compliance, self-adjustment and occasional light-headed symptoms. Patient to continue taking lisinopril  daily (she refused to increase dose to  daily), work on lifestyle modifications like low sodium, daily walks of 30-45 minutes, and check BP 2-3 times per week during rest once new cuff obtained. Patient to complete assessment for sleep apnea and to discuss anxiety management with PCP.  Next F/U with HTN clinic will be scheduled once all other assessments completed.  Okay to continue captopril  ODT on as needed bases for now, but instructed to avoid AS much as possible.  She ports last used over 1 months ago.  [name] PharmD, BCPS [facility] [address] [place] [address] 02/09/2017 7:43 AM

## 2017-02-08 NOTE — Patient Instructions (Addendum)
Return for a follow up appointment in as needed  Your blood pressure today is 140/78 pulse 66  Check your blood pressure at home daily (if able) and keep record of the readings.  Take your BP meds as follows: **STOP taking metoprolol ** **Continue taking lisinopril  daily** **Okay to use Captopril  under tongue ONLY AS NEEDED if elevated BP due to anxiety**   **Continue with lifestyle modifications (low sodium, low fat , increase excessive)**  Bring all of your meds, your BP cuff and your record of home blood pressures to your next appointment.  Exercise as you're able, try to walk approximately 30 minutes per day.  Keep salt intake to a minimum, especially watch canned and prepared boxed foods.  Eat more fresh fruits and vegetables and fewer canned items.  Avoid eating in fast food restaurants.    HOW TO TAKE YOUR BLOOD PRESSURE: . Rest 10 minutes before taking your blood pressure. .  Don't smoke or drink caffeinated beverages for at least 30 minutes before. . Take your blood pressure before (not after) you eat. . Sit comfortably with your back supported and both feet on the floor (don't cross your legs). . Elevate your arm to heart level on a table or a desk. . Use the proper sized cuff. It should fit smoothly and snugly around your bare upper arm. There should be enough room to slip a fingertip under the cuff. The bottom edge of the cuff should be 1 inch above the crease of the elbow. . Ideally, take 3 measurements at one sitting and record the average.

## 2017-02-14 ENCOUNTER — Ambulatory Visit (INDEPENDENT_AMBULATORY_CARE_PROVIDER_SITE_OTHER): Payer: Managed Care, Other (non HMO) | Admitting: Internal Medicine

## 2017-02-14 ENCOUNTER — Institutional Professional Consult (permissible substitution): Payer: Managed Care, Other (non HMO) | Admitting: Pulmonary Disease

## 2017-02-14 ENCOUNTER — Encounter: Payer: Self-pay | Admitting: Internal Medicine

## 2017-02-14 DIAGNOSIS — G4733 Obstructive sleep apnea (adult) (pediatric): Secondary | ICD-10-CM | POA: Diagnosis not present

## 2017-02-14 DIAGNOSIS — K219 Gastro-esophageal reflux disease without esophagitis: Secondary | ICD-10-CM

## 2017-02-14 DIAGNOSIS — J3089 Other allergic rhinitis: Secondary | ICD-10-CM

## 2017-02-14 DIAGNOSIS — J302 Other seasonal allergic rhinitis: Secondary | ICD-10-CM | POA: Diagnosis not present

## 2017-02-14 NOTE — Patient Instructions (Signed)
You may have been having obstructive sleep apnea. Losing weight and sleeping off the flat of your back have helped. Raising the head end of your bed-frame with a brick on the floor under each of the head legs of the bed may also help, and will help if you are having some reflux from your stomach during the night.  Order- schedule unattended home sleep test    Dx OSA    It will be ok for you to wait on this test if you choose. Please call us if you decide you want to go ahead and get it done.  Ok to try otc nasal spray Flonase/ fluticasone   1-2 pufs in each nostril once daily at bedtime. Doing this may help you breathe a little easier through your nose.

## 2017-02-14 NOTE — Progress Notes (Signed)
02/14/2017- 56 year old female never smoker referred courtesy of Dr Betty [place]; never had sleep study. Concerned for OSA. Pt states she is losing weight-feels her sleep is better and also has decreased her stress.  Medical problems include anxiety, GERD, HBP Body weight 188 pounds 12/12/2016>> 173 pounds 02/14/2017 Dieting and exercise.  She gained weight after hysterectomy with decreased mobility. She began noticing morning headaches, dry mouth and she had to sleep on her back. Frequently sleepy in the daytime. During that time had daytime sleepiness and witnessed apneas. She says with weight loss all of these problems are getting better. Aware of reflux. Aware of persistent watery nose without blockage.  Prior to Admission medications   Medication Sig Start Date End Date Taking? Authorizing Provider  aspirin 81 MG chewable tablet Chew 81 mg by mouth every other day.    Yes Historical Provider, MD  lisinopril (PRINIVIL,ZESTRIL) 5 MG tablet Take 1 tablet (5 mg total) by mouth daily. 01/05/17 04/05/17 Yes Runell Gess, MD  omeprazole (PRILOSEC) 20 MG capsule Take 1 capsule (20 mg total) by mouth daily. 12/26/16  Yes Betty G [place], MD   Past Medical History:  Diagnosis Date  . Anal fissure    hx of   . GERD (gastroesophageal reflux disease)   . H. pylori infection   . Hx: UTI (urinary tract infection)   . Hyperlipidemia   . Hypertension   . Irritable bowel syndrome (IBS)    No past surgical history on file. Family History  Problem Relation Age of Onset  . Diabetes Mother   . Heart disease Mother   . Stroke Mother   . Hypertension Mother   . Heart disease Father   . Colon cancer Neg Hx    Social History   Social History  . Marital status: Married    Spouse name: N/A  . Number of children: 3  . Years of education: N/A   Occupational History  . Not on file.   Social History Main Topics  . Smoking status: Never Smoker  . Smokeless tobacco: Never Used  . Alcohol use No  . Drug  use: No  . Sexual activity: Yes   Other Topics Concern  . Not on file   Social History Narrative   Caffeine daily    ROS-see HPI   Negative unless "+" Constitutional:    weight loss, night sweats, fevers, chills, fatigue, lassitude. HEENT:    headaches, difficulty swallowing, tooth/dental problems, sore throat,       sneezing, itching, ear ache, nasal congestion, post nasal drip, snoring CV:    chest pain, orthopnea, PND, swelling in lower extremities, anasarca,                                                        dizziness, palpitations Resp:   shortness of breath with exertion or at rest.                productive cough,   non-productive cough, coughing up of blood.              change in color of mucus.  wheezing.   Skin:    rash or lesions. GI:  No-   heartburn, indigestion, abdominal pain, nausea, vomiting, diarrhea,  change in bowel habits, loss of appetite GU: dysuria, change in color of urine, no urgency or frequency.   flank pain. MS:   joint pain, stiffness, decreased range of motion, back pain. Neuro-     nothing unusual Psych:  change in mood or affect.  depression or anxiety.   memory loss.  OBJ- Physical Exam General- Alert, Oriented, Affect-appropriate, Distress- none acute, + overweight Skin- rash-none, lesions- none, excoriation- none Lymphadenopathy- none Head- atraumatic            Eyes- Gross vision intact, PERRLA, conjunctivae and secretions clear            Ears- Hearing, canals-normal            Nose- + mild turbinate edema, no-Septal dev, mucus, polyps, erosion, perforation             Throat- Mallampati II-III , mucosa clear , drainage- none, tonsils- atrophic Neck- flexible , trachea midline, no stridor , thyroid nl, carotid no bruit Chest - symmetrical excursion , unlabored           Heart/CV- RRR , no murmur , no gallop  , no rub, nl s1 s2                           - JVD- none , edema- none, stasis changes- none, varices- none            Lung- clear to P&A, wheeze- none, cough- none , dullness-none, rub- none           Chest wall-  Abd-  Br/ Gen/ Rectal- Not done, not indicated Extrem- cyanosis- none, clubbing, none, atrophy- none, strength- nl Neuro- grossly intact to observation

## 2017-02-16 DIAGNOSIS — G4733 Obstructive sleep apnea (adult) (pediatric): Secondary | ICD-10-CM | POA: Insufficient documentation

## 2017-02-16 DIAGNOSIS — J3089 Other allergic rhinitis: Secondary | ICD-10-CM

## 2017-02-16 DIAGNOSIS — J302 Other seasonal allergic rhinitis: Secondary | ICD-10-CM | POA: Insufficient documentation

## 2017-02-16 NOTE — Assessment & Plan Note (Signed)
Bothersome postnasal drip may respond adequately to Flonase and nonsedating antihistamines

## 2017-02-16 NOTE — Assessment & Plan Note (Signed)
She gives a good description of obstructive sleep apnea symptoms during the interval when she was substantially overweight. She indicates significant improvement and she has accomplished 30 pound weight loss. Her goal is to continue losing weight. After discussion, we have agreed to defer sleep study. She finds related symptoms are continuing to intrude and she will call us to schedule. We have reviewed the basics of good sleep hygiene, the medical concerns associated with OSA, her responsibility to drive safely, the importance of maintaining healthy weight.

## 2017-02-16 NOTE — Assessment & Plan Note (Signed)
She describes a full feeling in upper chest and symptoms suggestive of reflux when supine. She is now on an acid blocker. I reviewed the advantages of elevating the head of bed to reduce mechanical reflux.

## 2017-02-22 ENCOUNTER — Telehealth: Payer: Self-pay | Admitting: Internal Medicine

## 2017-02-22 ENCOUNTER — Telehealth: Payer: Self-pay | Admitting: Cardiovascular Disease

## 2017-02-22 DIAGNOSIS — G4733 Obstructive sleep apnea (adult) (pediatric): Secondary | ICD-10-CM

## 2017-02-22 NOTE — Telephone Encounter (Signed)
New message    Pt calling back because she was instructed to call after her last appt. Requests a call back as soon as possible.

## 2017-02-22 NOTE — Telephone Encounter (Signed)
Returned call, pt mistakenly called this office and noted she meant to call Dr. Maple Hudson regarding a question about her CPAP. Provided number to Dr. Roxy Cedar office, she's aware to f/u w [place] should we be able to address any other concerns for her. Pt voiced acknowledgment and thanks.

## 2017-02-22 NOTE — Progress Notes (Signed)
[name] is a 56 y.o.female, who is here today to follow on some chronic medical problems.  Since her last OV she has followed with cardiologists, Dr Sherlon Handing and Dr Allyson Sabal. She also followed with Dr Maple Hudson and formally Dx with OSA, she is waiting for CPAP machine. I saw her last on 12/26/16, when she was very concerned about fluctuation in BP's and chest pain.  She is asking me if I have received results from stress test and echo, states that she has not heard about results.   HTN:  She denies visual changes, exertional chest pain, dyspnea,  focal weakness, or edema. Currently she  is on Lisinopril 5 mg. Metoprolol Tartrate was discontinued due to lightheadedness, she was not taking med daily. She is also on Captopril prn.  She is taking medications as instructed now, no side effects reported. But wonders if she needs to continue it.  She is not checking BP but she is certain it is "high" sometimes. States that her BP is still high sometimes, mainly in the morning, headache;  that improve after taking Captopril. Headache re-occurs a couple hours later: Occipital and frontal pressure like headache and ear pressure sensation.   + Rhinorrhea and nasal congestion.  Denies earache ,fever, or sick contact.  Lab Results  Component Value Date   CREATININE 0.63 01/26/2017   BUN 14 01/26/2017   NA 139 01/26/2017   K 3.9 01/26/2017   CL 104 01/26/2017   CO2 32 (H) 01/26/2017   Hx of GERD, She is taking Omeprazole 20 mg daily. She is not longer having chest discomfort or heartburn. She has decreased meal portions and eating healthier.  She is also exercising regularly.  Denies abdominal pain, nausea, vomiting, changes in bowel habits, blood in stool or melena.  "Circulation problems": + Tingling on hands R>L, mainly at night , relieved by shaking hands. No weakness, edema,or erythema. Problem has been going on for a a while. She has not identified exacerbation factors  but mentions that she "always" sleeps on her left side to avoid discomfort. No cervical pain. Also tingling of right great toe, intermittent and also for a few years. Symptoms stable overall.  Anxiety: She is still afraid of having a serious illness.  Denies suicidal thoughts. Not checking BP has helped some but still having episodes of anxiety. She denies depressed mood.  She is concerned because has not had sleep study done, she tells me that she has call a few times to Dr Roxy Cedar office and wonders if I can order study and/or speed process.    Review of Systems  Constitutional: Positive for fatigue. Negative for appetite change, diaphoresis and fever.  HENT: Positive for congestion and rhinorrhea. Negative for ear discharge, hearing loss, mouth sores, nosebleeds, sinus pain, sore throat, trouble swallowing and voice change.   Eyes: Negative for pain and visual disturbance.  Respiratory: Negative for cough, shortness of breath and wheezing.   Cardiovascular: Negative for chest pain, palpitations and leg swelling.  Gastrointestinal: Negative for abdominal pain, nausea and vomiting.       Negative for changes in bowel habits.  Genitourinary: Negative for decreased urine volume and hematuria.  Musculoskeletal: Negative for gait problem and myalgias.  Skin: Negative for pallor and rash.  Allergic/Immunologic: Positive for environmental allergies.  Neurological: Positive for headaches. Negative for syncope and weakness.  Psychiatric/Behavioral: Negative for confusion and suicidal ideas. The patient is nervous/anxious.      Current Outpatient Prescriptions on File Prior to  Visit  Medication Sig Dispense Refill  . aspirin 81 MG chewable tablet Chew 81 mg by mouth every other day.     . lisinopril (PRINIVIL,ZESTRIL) 5 MG tablet Take 1 tablet (5 mg total) by mouth daily. 90 tablet 3  . omeprazole (PRILOSEC) 20 MG capsule Take 1 capsule (20 mg total) by mouth daily. 30 capsule 3   No  current facility-administered medications on file prior to visit.      Past Medical History:  Diagnosis Date  . Anal fissure    hx of   . GERD (gastroesophageal reflux disease)   . H. pylori infection   . Hx: UTI (urinary tract infection)   . Hyperlipidemia   . Hypertension   . Irritable bowel syndrome (IBS)     Allergies  Allergen Reactions  . Penicillins Rash    Has patient had a PCN reaction causing immediate rash, facial/tongue/throat swelling, SOB or lightheadedness with hypotension: No Has patient had a PCN reaction causing severe rash involving mucus membranes or skin necrosis: No Has patient had a PCN reaction that required hospitalization No Has patient had a PCN reaction occurring within the last 10 years: No If all of the above answers are "NO", then may proceed with Cephalosporin use.     Social History   Social History  . Marital status: Married    Spouse name: N/A  . Number of children: 3  . Years of education: N/A   Social History Main Topics  . Smoking status: Never Smoker  . Smokeless tobacco: Never Used  . Alcohol use No  . Drug use: No  . Sexual activity: Yes   Other Topics Concern  . None   Social History Narrative   Caffeine daily     Vitals:   02/23/17 1441 02/23/17 1529  BP: 118/80 120/80  Pulse: 63   Resp: 12   O2 sat at RA 98% Body mass index is 30.53 kg/m.  Wt Readings from Last 3 Encounters:  02/23/17 172 lb 6 oz (78.2 kg)  02/14/17 173 lb 12.8 oz (78.8 kg)  01/10/17 182 lb (82.6 kg)    Physical Exam  Nursing note and vitals reviewed. Constitutional: She is oriented to person, place, and time. She appears well-developed. No distress.  HENT:  Head: Atraumatic.  Mouth/Throat: Oropharynx is clear and moist and mucous membranes are normal.  Eyes: Conjunctivae and EOM are normal. Pupils are equal, round, and reactive to light.  Cardiovascular: Normal rate and regular rhythm.   No murmur heard. Pulses:      Radial pulses  are 2+ on the right side, and 2+ on the left side.       Dorsalis pedis pulses are 2+ on the right side, and 2+ on the left side.  Varicose veins LE,bilateral: Telangiectasias mainly.  Respiratory: Effort normal and breath sounds normal. No respiratory distress. She exhibits no tenderness.  GI: Soft. She exhibits no mass. There is no hepatomegaly. There is no tenderness.  Musculoskeletal: She exhibits no edema or tenderness.  Tinel and Phalen negatve bilateral. No tenderness upon palpation or ROM of wrists,MCP,or IP joints bilateral. No signs of synovitis.   Lymphadenopathy:    She has no cervical adenopathy.  Neurological: She is alert and oriented to person, place, and time. She has normal strength. No cranial nerve deficit. Coordination and gait normal.  Skin: Skin is warm. No erythema.  Psychiatric: Her mood appears anxious.  Poor groomed, good eye contact.    ASSESSMENT AND PLAN:  Darcy was seen today for follow-up.  Diagnoses and all orders for this visit:  Gastroesophageal reflux disease, esophagitis presence not specified  Improved. No changes in current management. F/U in 6-12 months.  Hypertension, essential  Adequately controlled today. No changes in current management.Some side effects discussed,caution with combination Captopril and Lisinopril. DASlow diet to continue. Eye exam annually. F/U in 6 months, before if needed.  Seasonal and perennial allergic rhinitis  Headache could be related to allergies as well (among other possible etiologies). Nasal irrigations with saline. OTC antihistaminic and Flonase nasal spray may also help. F/U as needed.  -     fluticasone (FLONASE) 50 MCG/ACT nasal spray; Place 1 spray into both nostrils 2 (two) times daily. -     fexofenadine (ALLEGRA ALLERGY) 180 MG tablet; Take 1 tablet (180 mg total) by mouth daily.  GAD (generalized anxiety disorder)  She is not interested in medication. Psychotherapy may help, strongly  recommended.  Disturbance of skin sensation  Since 10/2007. Tingling of hands could be related to position while asleep. ? Carpal tunnel synd, negative examination,  wrist splint may help. Stable for years, will continue following. Instructed about warning signs. Lab Results  Component Value Date   TSH 0.71 12/13/2016   Will  add B12 level to her next lab work.   Obesity, Class I, BMI 30-34.9  She has lost about 10 lb sine 12/2016. We discussed benefits of wt loss as well as adverse effects of obesity. Consistency with healthy diet and physical activity to continue.   Varicose veins of both lower extremities without ulcer or inflammation  Mild. Compression stockings may help.States that she has tried and cannot tolerate them. LE elevation as needed during the day.    -Ms. Tiaira Arambula was advised to return sooner than planned today if new concerns arise.     Betty G. [place], MD  [facility]. [place] office.

## 2017-02-22 NOTE — Telephone Encounter (Signed)
Pt calling requesting to move forward with CPAP at this point.  Please advise on CPAP order.  Pt does not want to wait until after the HST.  Please advise Dr Maple Hudson. Thanks.  ------------- Also patient is wanting to speak to a [facility] about the HST order.  Please advise [facility]. Thanks.

## 2017-02-23 ENCOUNTER — Encounter: Payer: Self-pay | Admitting: Family Medicine

## 2017-02-23 ENCOUNTER — Ambulatory Visit (INDEPENDENT_AMBULATORY_CARE_PROVIDER_SITE_OTHER): Payer: Managed Care, Other (non HMO) | Admitting: Family Medicine

## 2017-02-23 VITALS — BP 120/80 | HR 63 | Resp 12 | Ht 63.0 in | Wt 172.4 lb

## 2017-02-23 DIAGNOSIS — J302 Other seasonal allergic rhinitis: Secondary | ICD-10-CM

## 2017-02-23 DIAGNOSIS — I8393 Asymptomatic varicose veins of bilateral lower extremities: Secondary | ICD-10-CM

## 2017-02-23 DIAGNOSIS — E669 Obesity, unspecified: Secondary | ICD-10-CM

## 2017-02-23 DIAGNOSIS — K219 Gastro-esophageal reflux disease without esophagitis: Secondary | ICD-10-CM | POA: Diagnosis not present

## 2017-02-23 DIAGNOSIS — F411 Generalized anxiety disorder: Secondary | ICD-10-CM | POA: Diagnosis not present

## 2017-02-23 DIAGNOSIS — J3089 Other allergic rhinitis: Secondary | ICD-10-CM

## 2017-02-23 DIAGNOSIS — R209 Unspecified disturbances of skin sensation: Secondary | ICD-10-CM | POA: Diagnosis not present

## 2017-02-23 DIAGNOSIS — I10 Essential (primary) hypertension: Secondary | ICD-10-CM

## 2017-02-23 MED ORDER — FEXOFENADINE HCL 180 MG PO TABS: 180.0000 mg | ORAL_TABLET | Freq: Every day | ORAL | 2 refills | Status: DC

## 2017-02-23 MED ORDER — FLUTICASONE PROPIONATE 50 MCG/ACT NA SUSP: 1.0000 | Freq: Two times a day (BID) | NASAL | 3 refills | Status: DC

## 2017-02-23 NOTE — Telephone Encounter (Signed)
Left message for pt to call back Sally E Ottinger ° °

## 2017-02-23 NOTE — Telephone Encounter (Signed)
Spoke to pt she does want to go ahead with the HST I will need an order Tobe Sos

## 2017-02-23 NOTE — Progress Notes (Signed)
Pre visit review using our clinic review tool, if applicable. No additional management support is needed unless otherwise documented below in the visit note. 

## 2017-02-23 NOTE — Telephone Encounter (Signed)
Ok- order unattended [employer] for dx OSA

## 2017-02-23 NOTE — Patient Instructions (Signed)
A few things to remember from today's visit:   Hypertension, essential  GAD (generalized anxiety disorder)  Gastroesophageal reflux disease, esophagitis presence not specified  Seasonal and perennial allergic rhinitis - Plan: fluticasone (FLONASE) 50 MCG/ACT nasal spray, fexofenadine (ALLEGRA ALLERGY) 180 MG tablet   Please be sure medication list is accurate. If a new problem present, please set up appointment sooner than planned today.

## 2017-02-23 NOTE — Telephone Encounter (Signed)
HST ordered.  Will close encounter.

## 2017-02-23 NOTE — Telephone Encounter (Signed)
We need to have documentation that she has diagnosed obstructive sleep apnea or insurnace will not pay for CPAP and home care company won't provide it. I strongly recommend we do this by the book, the right way, so we know what we are doing.

## 2017-04-10 ENCOUNTER — Ambulatory Visit (INDEPENDENT_AMBULATORY_CARE_PROVIDER_SITE_OTHER): Payer: Managed Care, Other (non HMO) | Admitting: Cardiovascular Disease

## 2017-04-10 ENCOUNTER — Encounter: Payer: Self-pay | Admitting: Cardiovascular Disease

## 2017-04-10 ENCOUNTER — Encounter (INDEPENDENT_AMBULATORY_CARE_PROVIDER_SITE_OTHER): Payer: Self-pay

## 2017-04-10 DIAGNOSIS — R0789 Other chest pain: Secondary | ICD-10-CM | POA: Diagnosis not present

## 2017-04-10 DIAGNOSIS — I10 Essential (primary) hypertension: Secondary | ICD-10-CM

## 2017-04-10 DIAGNOSIS — G4733 Obstructive sleep apnea (adult) (pediatric): Secondary | ICD-10-CM

## 2017-04-10 NOTE — Assessment & Plan Note (Signed)
History of obstructive sleep apnea currently being assessed for CPAP

## 2017-04-10 NOTE — Progress Notes (Signed)
04/10/2017 Kipp BroodZahra Boberg   1961/03/19  [id number]  Primary Physician [place], Betty G, MD Primary Cardiologist: Runell GessJonathan J Bethannie Iglehart MD Roseanne RenoFACP, FACC, FAHA, FSCAI  HPI:  Ms Farrell OursRezvani is a 56 year old moderately overweight married [place] female mother of 3 daughters referred by Dr. Betty [place]Jordan for cardiovascular evaluation because of hypertension and atypical chest pain. I last saw her in the office 01/05/17. [place] the [place] years ago. She works at [employer] where she is exposed to chemicals and lifts heavy objects. Her cardiac risk factor profile is notable only for hypertension and family history. She says both of her parents had "stents". She has never had a heart attack or stroke. He does get occasional atypical chest pain. She's had a negative Myoview stress test remotely in [place]. She is fairly anxious and tearful today in the office. She is worried that she is "going to die". I have reassured her. I reviewed her blood pressure log and the most part her blood pressure is well controlled except for some isolated elevated diastolic blood pressures. She apparently was having side effects from lisinopril which she discontinued on her own and her blood pressures have been well well controlled despite this. She did have a Myoview stress test performed 01/10/17 which was entirely normal and since that time her chest pain has resolved spontaneously.   Current Outpatient Prescriptions  Medication Sig Dispense Refill  . aspirin 81 MG chewable tablet Chew 81 mg by mouth every other day.     . fexofenadine (ALLEGRA ALLERGY) 180 MG tablet Take 1 tablet (180 mg total) by mouth daily. 30 tablet 2  . fluticasone (FLONASE) 50 MCG/ACT nasal spray Place 1 spray into both nostrils 2 (two) times daily. 16 g 3  . lisinopril (PRINIVIL,ZESTRIL) 5 MG tablet Take 5 mg by mouth daily.    Marland Kitchen. omeprazole (PRILOSEC) 20 MG capsule Take 1 capsule (20 mg total) by mouth daily. 30 capsule 3   No current  facility-administered medications for this visit.     Allergies  Allergen Reactions  . Penicillins Rash    Has patient had a PCN reaction causing immediate rash, facial/tongue/throat swelling, SOB or lightheadedness with hypotension: No Has patient had a PCN reaction causing severe rash involving mucus membranes or skin necrosis: No Has patient had a PCN reaction that required hospitalization No Has patient had a PCN reaction occurring within the last 10 years: No If all of the above answers are "NO", then may proceed with Cephalosporin use.     Social History   Social History  . Marital status: Married    Spouse name: N/A  . Number of children: 3  . Years of education: N/A   Occupational History  . Not on file.   Social History Main Topics  . Smoking status: Never Smoker  . Smokeless tobacco: Never Used  . Alcohol use No  . Drug use: No  . Sexual activity: Yes   Other Topics Concern  . Not on file   Social History Narrative   Caffeine daily      Review of Systems: General: negative for chills, fever, night sweats or weight changes.  Cardiovascular: negative for chest pain, dyspnea on exertion, edema, orthopnea, palpitations, paroxysmal nocturnal dyspnea or shortness of breath Dermatological: negative for rash Respiratory: negative for cough or wheezing Urologic: negative for hematuria Abdominal: negative for nausea, vomiting, diarrhea, bright red blood per rectum, melena, or hematemesis Neurologic: negative for visual changes, syncope, or dizziness All  other systems reviewed and are otherwise negative except as noted above.    Blood pressure 136/80, pulse 72, height 5\' 3"  (1.6 m), weight 164 lb 12.8 oz (74.8 kg), last menstrual period 12/21/2012, SpO2 98 %.  General appearance: alert and no distress Neck: no adenopathy, no carotid bruit, no JVD, supple, symmetrical, trachea midline and thyroid not enlarged, symmetric, no tenderness/mass/nodules Lungs: clear to  auscultation bilaterally Heart: regular rate and rhythm, S1, S2 normal, no murmur, click, rub or gallop Extremities: extremities normal, atraumatic, no cyanosis or edema  EKG not performed today  ASSESSMENT AND PLAN:   Hypertension, essential History of essential hypertension blood pressure measured 120/80. She was on lisinopril however this was causing side effects which she discontinued and her blood pressure has remained well controlled despite this. She is aware salt restriction  Atypical chest pain History of atypical chest pain with recent negative Myoview performed 01/10/17. She's had no further symptoms  Obstructive sleep apnea History of obstructive sleep apnea currently being assessed for CPAP      Runell Gess MD [facility], [facility] 04/10/2017 3:44 PM

## 2017-04-10 NOTE — Assessment & Plan Note (Signed)
History of atypical chest pain with recent negative Myoview performed 01/10/17. She's had no further symptoms

## 2017-04-10 NOTE — Patient Instructions (Signed)
Medication Instructions: Your physician recommends that you continue on your current medications as directed. Please refer to the Current Medication list given to you today.   Follow-Up: Your physician recommends that you schedule a follow-up appointment as needed with Dr. Berry.    

## 2017-04-10 NOTE — Assessment & Plan Note (Signed)
History of essential hypertension blood pressure measured 120/80. She was on lisinopril however this was causing side effects which she discontinued and her blood pressure has remained well controlled despite this. She is aware salt restriction

## 2017-04-17 ENCOUNTER — Telehealth: Payer: Self-pay | Admitting: Family Medicine

## 2017-04-17 DIAGNOSIS — R519 Headache, unspecified: Secondary | ICD-10-CM

## 2017-04-17 DIAGNOSIS — R51 Headache: Secondary | ICD-10-CM

## 2017-04-17 DIAGNOSIS — R0989 Other specified symptoms and signs involving the circulatory and respiratory systems: Secondary | ICD-10-CM

## 2017-04-17 NOTE — Telephone Encounter (Signed)
Ok to place referral as requested.  Thanks

## 2017-04-17 NOTE — Telephone Encounter (Signed)
Referral placed.

## 2017-04-17 NOTE — Telephone Encounter (Signed)
Referral okay? 

## 2017-04-17 NOTE — Addendum Note (Signed)
Addended by: Marcell AngerSELF, Ryleah Miramontes E on: 04/17/2017 01:53 PM   Modules accepted: Orders

## 2017-04-17 NOTE — Telephone Encounter (Signed)
° ° ° °  Pt ask to be referred to a ENT,she said her nose is always running and she is getting headaches    [phone]

## 2017-04-19 DIAGNOSIS — G4733 Obstructive sleep apnea (adult) (pediatric): Secondary | ICD-10-CM | POA: Diagnosis not present

## 2017-04-20 DIAGNOSIS — G4733 Obstructive sleep apnea (adult) (pediatric): Secondary | ICD-10-CM | POA: Diagnosis not present

## 2017-04-23 ENCOUNTER — Other Ambulatory Visit: Payer: Self-pay | Admitting: *Deleted

## 2017-04-23 DIAGNOSIS — G4733 Obstructive sleep apnea (adult) (pediatric): Secondary | ICD-10-CM

## 2017-04-26 ENCOUNTER — Telehealth: Payer: Self-pay | Admitting: Internal Medicine

## 2017-04-26 NOTE — Telephone Encounter (Signed)
Notes recorded by Cydney OkAugustin, Tamara N, CMA on 04/26/2017 at 4:30 PM EDT ATC pt, no answer. Left message for pt to call back.  ------  Notes recorded by Waymon BudgeYoung, Clinton D, MD on 04/25/2017 at 3:55 PM EDT Unattended Home Sleep Test-her sleep study did not show obstructive sleep apnea. Her score was within normal limits. She does not qualify for CPAP based on this study through insurance won't pay for it. We can discuss in further detail at her appointment in August if she wants to keep that appointment.   Pt is aware results and wants to cancel August appt. I have canceled this apt; nothing more needed at this time.

## 2017-06-15 ENCOUNTER — Ambulatory Visit: Payer: Managed Care, Other (non HMO) | Admitting: Internal Medicine

## 2017-08-09 DIAGNOSIS — E785 Hyperlipidemia, unspecified: Secondary | ICD-10-CM | POA: Insufficient documentation

## 2017-08-09 NOTE — Progress Notes (Signed)
HPI:   [name] is a 56 y.o. female, who is here today for her routine physical.  Last CPE: A year ago. She follows with gyn and her gyn preventive care is current, last visit 08/08/17.  Regular exercise 3 or more time per week: Yes Following a healthy diet: Yes.  She lives with her husband.  Chronic medical problems: Anxiety,GERD,HTN,OSA,allergic rhinitis, and HLD among some. She has lost wt through a healthy diet and regular exercise. She is not longer on pharmacologic treatment for her chronic problems, inclufding antihypertensive medication.  She follows with ENT (Dr Pollyann Kennedy) for rhinitis, cardiologists (Dr Allyson Sabal) for HTN and palpitation, pulmonologist (Dr Maple Hudson)  HLD on non pharmacologic treatment.   Immunization History  Administered Date(s) Administered  . Td 08/21/2008   Last eye exam many years ago.  Pap smear 08/2017. Mammogram: 08/08/2017. Colonoscopy: 4 years ago.  Ca++ with vit D supplementation: No B12 and Omega 3.  Hep C screening: Denies risk factors, she has not had HCV screening.  She mentions that "sometimes" she has frontal headache, right ear pressure,and left toe numbness. These symptoms have been going on for years and she tells me that they are stable. She would like to have her liver check, denies Hx of abdomina LFT's or high alcohol intake. She mentions that one of her friends died from liver cancer, she adds that he was 54 yo and smoker.   Review of Systems  Constitutional: Negative for appetite change, diaphoresis, fatigue and fever.  HENT: Positive for rhinorrhea. Negative for dental problem, ear pain, facial swelling, hearing loss, mouth sores, sore throat, trouble swallowing and voice change.   Eyes: Negative for redness and visual disturbance.  Respiratory: Negative for cough, shortness of breath and wheezing.   Cardiovascular: Negative for chest pain, palpitations and leg swelling.  Gastrointestinal: Negative for abdominal  pain, nausea and vomiting.       No changes in bowel habits.  Endocrine: Negative for cold intolerance, heat intolerance, polydipsia, polyphagia and polyuria.  Genitourinary: Negative for decreased urine volume, dysuria, hematuria, vaginal bleeding and vaginal discharge.  Musculoskeletal: Positive for arthralgias (rigth foot). Negative for gait problem, joint swelling and neck pain.  Skin: Negative for color change and rash.  Allergic/Immunologic: Positive for environmental allergies.  Neurological: Negative for dizziness, syncope, facial asymmetry, speech difficulty and weakness.  Hematological: Negative for adenopathy. Does not bruise/bleed easily.  Psychiatric/Behavioral: Positive for sleep disturbance. Negative for confusion. The patient is nervous/anxious.   All other systems reviewed and are negative.   No current outpatient prescriptions on file prior to visit.   No current facility-administered medications on file prior to visit.      Past Medical History:  Diagnosis Date  . Anal fissure    hx of   . GERD (gastroesophageal reflux disease)   . H. pylori infection   . Hx: UTI (urinary tract infection)   . Hyperlipidemia   . Hypertension   . Irritable bowel syndrome (IBS)     Past Surgical History:  Procedure Laterality Date  . ABDOMINAL HYSTERECTOMY  10/2016    Allergies  Allergen Reactions  . Penicillins Rash    Has patient had a PCN reaction causing immediate rash, facial/tongue/throat swelling, SOB or lightheadedness with hypotension: No Has patient had a PCN reaction causing severe rash involving mucus membranes or skin necrosis: No Has patient had a PCN reaction that required hospitalization No Has patient had a PCN reaction occurring within the last 10 years: No If  all of the above answers are "NO", then may proceed with Cephalosporin use.     Family History  Problem Relation Age of Onset  . Diabetes Mother   . Heart disease Mother   . Stroke Mother   .  Hypertension Mother   . Heart disease Father   . Colon cancer Neg Hx     Social History   Social History  . Marital status: Married    Spouse name: N/A  . Number of children: 3  . Years of education: N/A   Social History Main Topics  . Smoking status: Never Smoker  . Smokeless tobacco: Never Used  . Alcohol use No  . Drug use: No  . Sexual activity: Yes   Other Topics Concern  . None   Social History Narrative   Caffeine daily      Vitals:   08/10/17 1350  BP: 118/70  Pulse: 60  Resp: 12  SpO2: 98%   Body mass index is 26.66 kg/m.   Wt Readings from Last 3 Encounters:  08/10/17 150 lb 8 oz (68.3 kg)  04/10/17 164 lb 12.8 oz (74.8 kg)  02/23/17 172 lb 6 oz (78.2 kg)    Physical Exam  Nursing note and vitals reviewed. Constitutional: She is oriented to person, place, and time. She appears well-developed and well-nourished. No distress.  HENT:  Head: Normocephalic and atraumatic.  Right Ear: Hearing, tympanic membrane, external ear and ear canal normal.  Left Ear: Hearing, tympanic membrane, external ear and ear canal normal.  Mouth/Throat: Uvula is midline, oropharynx is clear and moist and mucous membranes are normal.  Eyes: Pupils are equal, round, and reactive to light. Conjunctivae and EOM are normal.  Neck: No tracheal deviation present. No thyroid mass and no thyromegaly present.  Cardiovascular: Normal rate and regular rhythm.   No murmur heard. Pulses:      Dorsalis pedis pulses are 2+ on the right side, and 2+ on the left side.  Varicose veins LE   Respiratory: Effort normal and breath sounds normal. No respiratory distress.  GI: Soft. She exhibits no mass. There is no hepatomegaly. There is no tenderness.  Genitourinary:  Genitourinary Comments: Deferred to Gyn  Musculoskeletal: She exhibits no edema or tenderness.  No major deformity or sing of synovitis appreciated. Bunions on feet, L>R.  Lymphadenopathy:    She has no cervical adenopathy.        Right: No supraclavicular adenopathy present.       Left: No supraclavicular adenopathy present.  Neurological: She is alert and oriented to person, place, and time. She has normal strength. No cranial nerve deficit. Coordination and gait normal.  Reflex Scores:      Bicep reflexes are 2+ on the right side and 2+ on the left side.      Patellar reflexes are 2+ on the right side and 2+ on the left side. Skin: Skin is warm. No rash noted. No erythema.  Psychiatric: Her speech is normal. Her mood appears anxious.  Well groomed, good eye contact.    ASSESSMENT AND PLAN:   Ms [name] was seen today for annual exam.  Diagnoses and all orders for this visit:  Lab Results  Component Value Date   CHOL 157 08/10/2017   HDL 53.00 08/10/2017   LDLCALC 96 08/10/2017   LDLDIRECT 132.4 08/21/2008   LDLDIRECT 132.4 08/21/2008   TRIG 37.0 08/10/2017   CHOLHDL 3 08/10/2017   Lab Results  Component Value Date   ALT 13 08/10/2017  AST 11 08/10/2017   ALKPHOS 57 08/10/2017   BILITOT 0.8 08/10/2017   Lab Results  Component Value Date   CREATININE 0.54 08/10/2017   BUN 11 08/10/2017   NA 135 08/10/2017   K 3.6 08/10/2017   CL 99 08/10/2017   CO2 30 08/10/2017    Routine general medical examination at a health care facility  We discussed the importance of regular physical activity and healthy diet for prevention of chronic illness and/or complications. Preventive guidelines reviewed. Vaccination up to date. Refused influenza vaccine. Continue following with gyn for her gyn preventive care. Eye exam recommended. Ca++ and vit D supplementation recommended. Next CPE in 1 year and prn.  The 10-year ASCVD risk score Denman George DC Montez Hageman., et al., 2013) is: 1.2%   Values used to calculate the score:     Age: 54 years     Sex: Female     Is Non-Hispanic African American: No     Diabetic: No     Tobacco smoker: No     Systolic Blood Pressure: 118 mmHg     Is BP treated: No     HDL  Cholesterol: 53 mg/dL     Total Cholesterol: 157 mg/dL  Encounter for HCV screening test for high risk patient -     Hepatitis C antibody screen  Hyperlipidemia, unspecified hyperlipidemia type  Mild. Last FLP was otherwise normal in 01/2017. Continue nonpharmacologic treatment. Further recommendations will be given according to lab results.  -     Lipid panel  Diabetes mellitus screening -     Comprehensive metabolic panel    Return in about 1 year (around 08/10/2018) for cpe.     Maytte Jacot G. [place], MD  [facility] office.

## 2017-08-10 ENCOUNTER — Ambulatory Visit (INDEPENDENT_AMBULATORY_CARE_PROVIDER_SITE_OTHER): Payer: Managed Care, Other (non HMO) | Admitting: Family Medicine

## 2017-08-10 ENCOUNTER — Encounter: Payer: Self-pay | Admitting: Family Medicine

## 2017-08-10 VITALS — BP 118/70 | HR 60 | Resp 12 | Ht 63.0 in | Wt 150.5 lb

## 2017-08-10 DIAGNOSIS — Z0000 Encounter for general adult medical examination without abnormal findings: Secondary | ICD-10-CM

## 2017-08-10 DIAGNOSIS — Z1159 Encounter for screening for other viral diseases: Secondary | ICD-10-CM

## 2017-08-10 DIAGNOSIS — Z9189 Other specified personal risk factors, not elsewhere classified: Secondary | ICD-10-CM

## 2017-08-10 DIAGNOSIS — E785 Hyperlipidemia, unspecified: Secondary | ICD-10-CM

## 2017-08-10 DIAGNOSIS — Z131 Encounter for screening for diabetes mellitus: Secondary | ICD-10-CM

## 2017-08-10 LAB — COMPREHENSIVE METABOLIC PANEL
ALT: 13 U/L (ref 0–35)
AST: 11 U/L (ref 0–37)
Albumin: 3.9 g/dL (ref 3.5–5.2)
Alkaline Phosphatase: 57 U/L (ref 39–117)
BUN: 11 mg/dL (ref 6–23)
CO2: 30 mEq/L (ref 19–32)
Calcium: 9.3 mg/dL (ref 8.4–10.5)
Chloride: 99 mEq/L (ref 96–112)
Creatinine, Ser: 0.54 mg/dL (ref 0.40–1.20)
GFR: 125.03 mL/min (ref >60.00)
GLUCOSE: 79 mg/dL (ref 70–99)
POTASSIUM: 3.6 meq/L (ref 3.5–5.1)
SODIUM: 135 meq/L (ref 135–145)
Total Bilirubin: 0.8 mg/dL (ref 0.2–1.2)
Total Protein: 6.5 g/dL (ref 6.0–8.3)

## 2017-08-10 LAB — LIPID PANEL
Cholesterol: 157 mg/dL (ref 0–200)
HDL: 53 mg/dL (ref >39.00)
LDL CALC: 96 mg/dL (ref 0–99)
NONHDL: 103.52
Total CHOL/HDL Ratio: 3
Triglycerides: 37 mg/dL (ref 0.0–149.0)
VLDL: 7.4 mg/dL (ref 0.0–40.0)

## 2017-08-10 NOTE — Patient Instructions (Addendum)
A few things to remember from today's visit:   Encounter for HCV screening test for high risk patient - Plan: Hepatitis C antibody screen  Hyperlipidemia, unspecified hyperlipidemia type - Plan: Lipid panel  Diabetes mellitus screening - Plan: Comprehensive metabolic panel  Routine general medical examination at a health care facility   Please be sure medication list is accurate. If a new problem present, please set up appointment sooner than planned today.        Health Maintenance, Female Adopting a healthy lifestyle and getting preventive care can go a long way to promote health and wellness. Talk with your health care provider about what schedule of regular examinations is right for you. This is a good chance for you to check in with your provider about disease prevention and staying healthy. In between checkups, there are plenty of things you can do on your own. Experts have done a lot of research about which lifestyle changes and preventive measures are most likely to keep you healthy. Ask your health care provider for more information. Weight and diet Eat a healthy diet  Be sure to include plenty of vegetables, fruits, low-fat dairy products, and lean protein.  Do not eat a lot of foods high in solid fats, added sugars, or salt.  Get regular exercise. This is one of the most important things you can do for your health. ? Most adults should exercise for at least 150 minutes each week. The exercise should increase your heart rate and make you sweat (moderate-intensity exercise). ? Most adults should also do strengthening exercises at least twice a week. This is in addition to the moderate-intensity exercise.  Maintain a healthy weight  Body mass index (BMI) is a measurement that can be used to identify possible weight problems. It estimates body fat based on height and weight. Your health care provider can help determine your BMI and help you achieve or maintain a healthy  weight.  For females 6 years of age and older: ? A BMI below 18.5 is considered underweight. ? A BMI of 18.5 to 24.9 is normal. ? A BMI of 25 to 29.9 is considered overweight. ? A BMI of 30 and above is considered obese.  Watch levels of cholesterol and blood lipids  You should start having your blood tested for lipids and cholesterol at 54 years of age, then have this test every 5 years.  You may need to have your cholesterol levels checked more often if: ? Your lipid or cholesterol levels are high. ? You are older than 54 years of age. ? You are at high risk for heart disease.  Cancer screening Lung Cancer  Lung cancer screening is recommended for adults 60-65 years old who are at high risk for lung cancer because of a history of smoking.  A yearly low-dose CT scan of the lungs is recommended for people who: ? Currently smoke. ? Have quit within the past 15 years. ? Have at least a 30-pack-year history of smoking. A pack year is smoking an average of one pack of cigarettes a day for 1 year.  Yearly screening should continue until it has been 15 years since you quit.  Yearly screening should stop if you develop a health problem that would prevent you from having lung cancer treatment.  Breast Cancer  Practice breast self-awareness. This means understanding how your breasts normally appear and feel.  It also means doing regular breast self-exams. Let your health care provider know about any changes, no  matter how small.  If you are in your 20s or 30s, you should have a clinical breast exam (CBE) by a health care provider every 1-3 years as part of a regular health exam.  If you are 40 or older, have a CBE every year. Also consider having a breast X-ray (mammogram) every year.  If you have a family history of breast cancer, talk to your health care provider about genetic screening.  If you are at high risk for breast cancer, talk to your health care provider about having an  MRI and a mammogram every year.  Breast cancer gene (BRCA) assessment is recommended for women who have family members with BRCA-related cancers. BRCA-related cancers include: ? Breast. ? Ovarian. ? Tubal. ? Peritoneal cancers.  Results of the assessment will determine the need for genetic counseling and BRCA1 and BRCA2 testing.  Cervical Cancer Your health care provider may recommend that you be screened regularly for cancer of the pelvic organs (ovaries, uterus, and vagina). This screening involves a pelvic examination, including checking for microscopic changes to the surface of your cervix (Pap test). You may be encouraged to have this screening done every 3 years, beginning at age 21.  For women ages 30-65, health care providers may recommend pelvic exams and Pap testing every 3 years, or they may recommend the Pap and pelvic exam, combined with testing for human papilloma virus (HPV), every 5 years. Some types of HPV increase your risk of cervical cancer. Testing for HPV may also be done on women of any age with unclear Pap test results.  Other health care providers may not recommend any screening for nonpregnant women who are considered low risk for pelvic cancer and who do not have symptoms. Ask your health care provider if a screening pelvic exam is right for you.  If you have had past treatment for cervical cancer or a condition that could lead to cancer, you need Pap tests and screening for cancer for at least 20 years after your treatment. If Pap tests have been discontinued, your risk factors (such as having a new sexual partner) need to be reassessed to determine if screening should resume. Some women have medical problems that increase the chance of getting cervical cancer. In these cases, your health care provider may recommend more frequent screening and Pap tests.  Colorectal Cancer  This type of cancer can be detected and often prevented.  Routine colorectal cancer screening  usually begins at 54 years of age and continues through 54 years of age.  Your health care provider may recommend screening at an earlier age if you have risk factors for colon cancer.  Your health care provider may also recommend using home test kits to check for hidden blood in the stool.  A small camera at the end of a tube can be used to examine your colon directly (sigmoidoscopy or colonoscopy). This is done to check for the earliest forms of colorectal cancer.  Routine screening usually begins at age 50.  Direct examination of the colon should be repeated every 5-10 years through 54 years of age. However, you may need to be screened more often if early forms of precancerous polyps or small growths are found.  Skin Cancer  Check your skin from head to toe regularly.  Tell your health care provider about any new moles or changes in moles, especially if there is a change in a mole's shape or color.  Also tell your health care provider if you have   a mole that is larger than the size of a pencil eraser.  Always use sunscreen. Apply sunscreen liberally and repeatedly throughout the day.  Protect yourself by wearing long sleeves, pants, a wide-brimmed hat, and sunglasses whenever you are outside.  Heart disease, diabetes, and high blood pressure  High blood pressure causes heart disease and increases the risk of stroke. High blood pressure is more likely to develop in: ? People who have blood pressure in the high end of the normal range (130-139/85-89 mm Hg). ? People who are overweight or obese. ? People who are African American.  If you are 80-46 years of age, have your blood pressure checked every 3-5 years. If you are 66 years of age or older, have your blood pressure checked every year. You should have your blood pressure measured twice-once when you are at a hospital or clinic, and once when you are not at a hospital or clinic. Record the average of the two measurements. To check  your blood pressure when you are not at a hospital or clinic, you can use: ? An automated blood pressure machine at a pharmacy. ? A home blood pressure monitor.  If you are between 76 years and 28 years old, ask your health care provider if you should take aspirin to prevent strokes.  Have regular diabetes screenings. This involves taking a blood sample to check your fasting blood sugar level. ? If you are at a normal weight and have a low risk for diabetes, have this test once every three years after 54 years of age. ? If you are overweight and have a high risk for diabetes, consider being tested at a younger age or more often. Preventing infection Hepatitis C  Blood testing is recommended for: ? Everyone born from [birth year] through 1963. ? Anyone with known risk factors for hepatitis C.  Sexually transmitted infections (STIs)  You should be screened for sexually transmitted infections (STIs) including gonorrhea and chlamydia if: ? You are sexually active and are younger than 54 years of age. ? You are older than 54 years of age and your health care provider tells you that you are at risk for this type of infection. ? Your sexual activity has changed since you were last screened and you are at an increased risk for chlamydia or gonorrhea. Ask your health care provider if you are at risk.  If you do not have HIV, but are at risk, it may be recommended that you take a prescription medicine daily to prevent HIV infection. This is called pre-exposure prophylaxis (PrEP). You are considered at risk if: ? You are sexually active and do not regularly use condoms or know the HIV status of your partner(s). ? You take drugs by injection. ? You are sexually active with a partner who has HIV.  Talk with your health care provider about whether you are at high risk of being infected with HIV. If you choose to begin PrEP, you should first be tested for HIV. You should then be tested every 3 months for as  long as you are taking PrEP.  Osteoporosis and menopause  Osteoporosis is a disease in which the bones lose minerals and strength with aging. This can result in serious bone fractures. Your risk for osteoporosis can be identified using a bone density scan.  If you are 21 years of age or older, or if you are at risk for osteoporosis and fractures, ask your health care provider if you should be screened.  Ask your health care provider whether you should take a calcium or vitamin D supplement to lower your risk for osteoporosis.  Menopause may have certain physical symptoms and risks.  Hormone replacement therapy may reduce some of these symptoms and risks. Talk to your health care provider about whether hormone replacement therapy is right for you. Follow these instructions at home:  Schedule regular health, dental, and eye exams.  Stay current with your immunizations.  Do not use any tobacco products including cigarettes, chewing tobacco, or electronic cigarettes.  If you are pregnant, do not drink alcohol.  If you are breastfeeding, limit how much and how often you drink alcohol.  Limit alcohol intake to no more than 1 drink per day for nonpregnant women. One drink equals 12 ounces of beer, 5 ounces of wine, or 1 ounces of hard liquor.  Do not use street drugs.  Do not share needles.  Ask your health care provider for help if you need support or information about quitting drugs.  Tell your health care provider if you often feel depressed.  Tell your health care provider if you have ever been abused or do not feel safe at home. This information is not intended to replace advice given to you by your health care provider. Make sure you discuss any questions you have with your health care provider. Document Released: 05/08/2011 Document Revised: 03/30/2016 Document Reviewed: 07/27/2015 Elsevier Interactive Patient Education  [employer].

## 2017-08-11 LAB — HEPATITIS C ANTIBODY
HEP C AB: NONREACTIVE
SIGNAL TO CUT-OFF: 0.01 (ref <1.00)

## 2017-12-12 ENCOUNTER — Ambulatory Visit: Payer: Managed Care, Other (non HMO) | Admitting: Physician Assistant

## 2017-12-13 NOTE — Progress Notes (Signed)
HPI:  Chief Complaint  Patient presents with  . Hypertension    having headaches, b/p has been elevated for a few days. Discuss medication options    [name] is a 57 y.o. female, who is here today to discussed HTN among other concerns.   She was last seen on 08/10/17 for her CPE.  Since her last OV she has had sleep study,which was negative, 04/2017.  She also was evaluated by ENT for rhinitis, Dr Pollyann Kennedy.  She just returned back from [place] and concerned because her BP was elevated while she was overseas. She states that she has been under a great deal of stress,her mother was having some health issues. She was seen by Dr Terese Door in [place], c/o elevated BP,palpitations, and chest discomfort. She had Holter monitor and echo done. LVEF 55%, trivial TR and MR. Holter monitor: HR from 46-81, SBP 90-172 DBP 30-101, most BP's 140-150's/70's.  She was not started on new medications,recommended to follow with PCP. She already arranged apt with cardiologist for 12/21/17.  She has Hx of HTN,anxiety,palpitations. In the past she has not tolerated or being compliant with treatment, she has tried Metoprolol,Lisinopril,Bisoprolol, and Captopril.   She has followed with Dr Allyson Sabal ,cardiologist.   "Pain in my heart,my head,and my ears" She has not identified exacerbating or alleviating symptoms.  Chest discomfort happens at rest, mainly when she is in bed laying on her back.  + Palpitations.  She has had cardiac work-up done and otherwise normal. Lexiscan stress test 01/2017: Essentially normal study. Echo 12/2016: mild LVH,otherwise normal. Myocardial perfusion scan 08/2016 negative for ischemia.   She "can not sleep." She is sleeping about 4 hours, lack of sleep causes headaches. Frontoparietal pressure headache.  She feels "like air is going through my head",blurry vision. Last eye exam over a year ago.  She takes Ibuprofen for headache and it helps. OTC Melatonin also  helps with his sleep.  She took a Lorazepam from her daughter's Rx, she slep "very good", "the whole night",7-8 hours.    Left earache, "very bad" when BP is high,occasional tinnitus. She denies hearing loss.  According to patient she has discussed problem with her ENT.  "Burping a lot", having episodes of heartburn.  She has not identified exacerbating or alleviating factors, she is trying to avoid foods that could aggravate symptoms.  She has history of GERD, she takes Omeprazole as needed.  She is also complaining of upper abdominal pain, sharp, exacerbated by certain foods.  Usually it last 5-10 minutes, no radiated. She is requesting her gallbladder check as well as liver and pancreas test. She also mentions that "sometimes" her skin is yellow. She denies high alcohol intake. She has a picture of her stool this morning, she is concerned because she is lighter than usual.  She has not noted blood in the stool.  She has some constipation.   Burping helps with chest discomfort and abdominal pain. She has not noted fever or chills.    Review of Systems  Constitutional: Positive for fatigue. Negative for activity change, appetite change and fever.  HENT: Positive for ear pain. Negative for congestion, dental problem, mouth sores, nosebleeds, sore throat and trouble swallowing.   Eyes: Positive for visual disturbance. Negative for redness.  Respiratory: Positive for shortness of breath. Negative for cough and wheezing.   Cardiovascular: Positive for chest pain and palpitations. Negative for leg swelling.  Gastrointestinal: Positive for abdominal pain and constipation. Negative for blood in stool,  nausea and vomiting.       Negative for changes in bowel habits.  Endocrine: Negative for cold intolerance, heat intolerance, polydipsia, polyphagia and polyuria.  Genitourinary: Negative for decreased urine volume, dysuria and hematuria.  Musculoskeletal: Negative for gait problem and neck  pain.  Skin: Negative for pallor and rash.  Allergic/Immunologic: Positive for environmental allergies.  Neurological: Positive for headaches. Negative for seizures, syncope and weakness.  Hematological: Negative for adenopathy. Does not bruise/bleed easily.  Psychiatric/Behavioral: Positive for sleep disturbance. Negative for confusion and hallucinations. The patient is nervous/anxious.      No current outpatient medications on file prior to visit.   No current facility-administered medications on file prior to visit.      Past Medical History:  Diagnosis Date  . Anal fissure    hx of   . GERD (gastroesophageal reflux disease)   . H. pylori infection   . Hx: UTI (urinary tract infection)   . Hyperlipidemia   . Hypertension   . Irritable bowel syndrome (IBS)    Allergies  Allergen Reactions  . Penicillins Rash    Has patient had a PCN reaction causing immediate rash, facial/tongue/throat swelling, SOB or lightheadedness with hypotension: No Has patient had a PCN reaction causing severe rash involving mucus membranes or skin necrosis: No Has patient had a PCN reaction that required hospitalization No Has patient had a PCN reaction occurring within the last 10 years: No If all of the above answers are "NO", then may proceed with Cephalosporin use.    Family History  Problem Relation Age of Onset  . Diabetes Mother   . Heart disease Mother   . Stroke Mother   . Hypertension Mother   . Heart disease Father   . Colon cancer Neg Hx     Social History   Socioeconomic History  . Marital status: Married    Spouse name: None  . Number of children: 3  . Years of education: None  . Highest education level: None  Social Needs  . Financial resource strain: None  . Food insecurity - worry: None  . Food insecurity - inability: None  . Transportation needs - medical: None  . Transportation needs - non-medical: None  Occupational History  . None  Tobacco Use  . Smoking  status: Never Smoker  . Smokeless tobacco: Never Used  Substance and Sexual Activity  . Alcohol use: No  . Drug use: No  . Sexual activity: Yes  Other Topics Concern  . None  Social History Narrative   Caffeine daily     Vitals:   12/14/17 0718  BP: 140/90  Pulse: 62  Resp: 12  Temp: 98.3 F (36.8 C)  SpO2: 99%   Body mass index is 27.64 kg/m.   Physical Exam  Nursing note and vitals reviewed. Constitutional: She is oriented to person, place, and time. She appears well-developed. No distress.  HENT:  Head: Normocephalic and atraumatic.  Mouth/Throat: Oropharynx is clear and moist and mucous membranes are normal.  Eyes: Conjunctivae and EOM are normal. Pupils are equal, round, and reactive to light.  Neck: No tracheal deviation present. No thyroid mass and no thyromegaly present.  Cardiovascular: Normal rate and regular rhythm.  No murmur heard. Pulses:      Dorsalis pedis pulses are 2+ on the right side, and 2+ on the left side.  Respiratory: Effort normal and breath sounds normal. No respiratory distress.  GI: Soft. She exhibits no mass. There is no hepatomegaly. There is no  tenderness.  Musculoskeletal: She exhibits no edema or tenderness.  Lymphadenopathy:    She has no cervical adenopathy.  Neurological: She is alert and oriented to person, place, and time. She has normal strength. No cranial nerve deficit. Coordination and gait normal.  Skin: Skin is warm. No rash noted. No erythema.  Psychiatric: Her mood appears anxious.  Well groomed, good eye contact.       ASSESSMENT AND PLAN:   [name] was seen today with several complaints.  Orders Placed This Encounter  Procedures  . Comprehensive metabolic panel  . Lipase  . CBC    Hypertension, essential I wanted to hold on antihypertensive medication for now,she has not been complaint with recommendations in the past and because it seems to be related to anxiety. She would like to take  antihypertensive medication,so Losartan 25 mg daily.  Possible complications of elevated BP discussed. Annual eye examination, she is due. F/U in 2-3 weeks.  Atypical chest pain She has had otherwise negative cardiac work-up. Other possible causes discussed, ? GERD,anxiety. She already has an appt with cardiologist 12/21/17. Instructed about warning signs.  GAD (generalized anxiety disorder) She will start Losartan at bedtime as needed. Sertraline 25 mg daily started. Instructed about warning signs. Psychotherapy also recommended. F/U 2-3 weeks.   Upper abdominal pain  Possible etiologies discussed. Explained that Hx does not suggest gallbladder disease or pancreatitis. Constipation and GERD/dyspepsia are most likely causing problem. Stool looks normal, reassured. Colonoscopy in 2012. Further recommendations will be given according to lab results  - Comprehensive metabolic panel  - Lipase - CBC  Gastroesophageal reflux disease, esophagitis presence not specified  She agrees with trying Omeprazole 40 mg daily. GERD precautions discussed. F/U in 2-3 weeks.  - omeprazole (PRILOSEC) 40 MG capsule; Take 1 capsule (40 mg total) by mouth daily.  Dispense: 30 capsule; Refill: 0  Somatization disorder  She will benefit from psychotherapy,may need psychiatric evaluation as well. I recommended Sertraline 25 mg daily, will plan on increasing dose next visit if well tolerated. F/U in 2 weeks.    7:20-8:16 am face to face OV. > 50% was dedicated to discussion of Dx's, prognosis, treatment options, and side effects of medications.     -Ms. Kipp BroodZahra Erby was advised to return sooner than planned today if new concerns arise.       Betty G. Swazil[place]Jordan, MD  [facility]. [place] office.

## 2017-12-14 ENCOUNTER — Other Ambulatory Visit: Payer: Managed Care, Other (non HMO)

## 2017-12-14 ENCOUNTER — Encounter: Payer: Self-pay | Admitting: Family Medicine

## 2017-12-14 ENCOUNTER — Ambulatory Visit (INDEPENDENT_AMBULATORY_CARE_PROVIDER_SITE_OTHER): Payer: Managed Care, Other (non HMO) | Admitting: Family Medicine

## 2017-12-14 VITALS — BP 140/90 | HR 62 | Temp 98.3°F | Resp 12 | Ht 61.61 in | Wt 149.2 lb

## 2017-12-14 DIAGNOSIS — K219 Gastro-esophageal reflux disease without esophagitis: Secondary | ICD-10-CM

## 2017-12-14 DIAGNOSIS — F450 Somatization disorder: Secondary | ICD-10-CM | POA: Diagnosis not present

## 2017-12-14 DIAGNOSIS — R0789 Other chest pain: Secondary | ICD-10-CM | POA: Diagnosis not present

## 2017-12-14 DIAGNOSIS — I10 Essential (primary) hypertension: Secondary | ICD-10-CM | POA: Diagnosis not present

## 2017-12-14 DIAGNOSIS — R1010 Upper abdominal pain, unspecified: Secondary | ICD-10-CM

## 2017-12-14 DIAGNOSIS — F411 Generalized anxiety disorder: Secondary | ICD-10-CM

## 2017-12-14 LAB — COMPREHENSIVE METABOLIC PANEL
ALK PHOS: 57 U/L (ref 39–117)
ALT: 14 U/L (ref 0–35)
AST: 14 U/L (ref 0–37)
Albumin: 4.1 g/dL (ref 3.5–5.2)
BILIRUBIN TOTAL: 0.9 mg/dL (ref 0.2–1.2)
BUN: 12 mg/dL (ref 6–23)
CALCIUM: 9.4 mg/dL (ref 8.4–10.5)
CO2: 32 mEq/L (ref 19–32)
CREATININE: 0.63 mg/dL (ref 0.40–1.20)
Chloride: 102 mEq/L (ref 96–112)
GFR: 104.52 mL/min (ref >60.00)
Glucose, Bld: 92 mg/dL (ref 70–99)
Potassium: 3.8 mEq/L (ref 3.5–5.1)
Sodium: 138 mEq/L (ref 135–145)
TOTAL PROTEIN: 7 g/dL (ref 6.0–8.3)

## 2017-12-14 LAB — CBC
HCT: 39.4 % (ref 35.0–45.0)
Hemoglobin: 13.4 g/dL (ref 11.7–15.5)
MCH: 30.5 pg (ref 27.0–33.0)
MCHC: 34 g/dL (ref 32.0–36.0)
MCV: 89.5 fL (ref 80.0–100.0)
MPV: 10.1 fL (ref 7.5–12.5)
PLATELETS: 264 10*3/uL (ref 140–400)
RBC: 4.4 10*6/uL (ref 3.80–5.10)
RDW: 11.8 % (ref 11.0–15.0)
WBC: 4.7 10*3/uL (ref 3.8–10.8)

## 2017-12-14 LAB — LIPASE: Lipase: 13 U/L (ref 11.0–59.0)

## 2017-12-14 MED ORDER — LOSARTAN POTASSIUM 25 MG PO TABS: 25.0000 mg | ORAL_TABLET | Freq: Every day | ORAL | 1 refills | Status: DC

## 2017-12-14 MED ORDER — OMEPRAZOLE 40 MG PO CPDR: 40.0000 mg | DELAYED_RELEASE_CAPSULE | Freq: Every day | ORAL | 0 refills | Status: DC

## 2017-12-14 MED ORDER — LORAZEPAM 0.5 MG PO TABS: 0.2500 mg | ORAL_TABLET | Freq: Three times a day (TID) | ORAL | 0 refills | Status: DC

## 2017-12-14 MED ORDER — SERTRALINE HCL 25 MG PO TABS: 25.0000 mg | ORAL_TABLET | Freq: Every day | ORAL | 1 refills | Status: DC

## 2017-12-14 NOTE — Assessment & Plan Note (Signed)
I wanted to hold on antihypertensive medication for now,she has not been complaint with recommendations in the past and because it seems to be related to anxiety. She would like to take antihypertensive medication,so Losartan 25 mg daily.  Possible complications of elevated BP discussed. Annual eye examination, she is due. F/U in 2-3 weeks.

## 2017-12-14 NOTE — Patient Instructions (Addendum)
A few things to remember from today's visit:   Upper abdominal pain - Plan: Comprehensive metabolic panel, Lipase, CBC  Hypertension, essential  Atypical chest pain - Plan: losartan (COZAAR) 25 MG tablet  GAD (generalized anxiety disorder) - Plan: sertraline (ZOLOFT) 25 MG tablet   Please be sure medication list is accurate. If a new problem present, please set up appointment sooner than planned today.

## 2017-12-14 NOTE — Assessment & Plan Note (Addendum)
She will start Losartan at bedtime as needed. Sertraline 25 mg daily started. Instructed about warning signs. Psychotherapy also recommended. F/U 2-3 weeks.

## 2017-12-14 NOTE — Assessment & Plan Note (Signed)
She has had otherwise negative cardiac work-up. Other possible causes discussed, ? GERD,anxiety. She already has an appt with cardiologist 12/21/17. Instructed about warning signs.

## 2017-12-15 NOTE — Progress Notes (Signed)
Lab work done recently is back and all normal (liver test,renal function and electrolytes,blood cells,and pancreas).  Thanks

## 2017-12-18 ENCOUNTER — Encounter: Payer: Self-pay | Admitting: *Deleted

## 2017-12-28 ENCOUNTER — Encounter: Payer: Self-pay | Admitting: Cardiovascular Disease

## 2017-12-28 ENCOUNTER — Ambulatory Visit (INDEPENDENT_AMBULATORY_CARE_PROVIDER_SITE_OTHER): Payer: Managed Care, Other (non HMO) | Admitting: Cardiovascular Disease

## 2017-12-28 DIAGNOSIS — R0789 Other chest pain: Secondary | ICD-10-CM

## 2017-12-28 DIAGNOSIS — I10 Essential (primary) hypertension: Secondary | ICD-10-CM

## 2017-12-28 DIAGNOSIS — E7800 Pure hypercholesterolemia, unspecified: Secondary | ICD-10-CM | POA: Diagnosis not present

## 2017-12-28 MED ORDER — LOSARTAN POTASSIUM 50 MG PO TABS: 50.0000 mg | ORAL_TABLET | Freq: Every day | ORAL | 3 refills | Status: DC

## 2017-12-28 NOTE — Patient Instructions (Signed)
Medication Instructions: INCREASE the Losartan to 50 mg daily  If you need a refill on your cardiac medications before your next appointment, please call your pharmacy.   Follow-Up: Your physician wants you to follow-up in: 3 months with an APP and 6 months with Dr. Allyson SabalBerry. Please follow up with pharmd, Belenda CruiseKristin, in one month.   Please keep a blood pressure log for one month. Bring this to your next appointment at the hypertension clinic.  [facility] you [facility]!!

## 2017-12-28 NOTE — Addendum Note (Signed)
Addended by: Runell GessBERRY, Jjesus Dingley J on: 12/28/2017 10:38 AM   Modules accepted: Level of Service

## 2017-12-28 NOTE — Assessment & Plan Note (Signed)
History of hyperlipidemia not on statin therapy with lipid profile performed 08/10/17 revealing total cholesterol 157, LDL 96 and HDL of 53.

## 2017-12-28 NOTE — Assessment & Plan Note (Signed)
History of essential hypertension with blood pressure 147/92. She is on low-dose losartan 25 mg a day which I'm going to increase to 50 mg a day. I've asked her to keep a blood pressure log 3 days and he see Belenda CruiseKristin back at that time to review her findings and titrate her medicines appropriately.

## 2017-12-28 NOTE — Progress Notes (Signed)
12/28/2017 [name]   08-10-1961  [id number]  Primary Physician [place], Betty G, MD Primary Cardiologist: Runell Gess MD Nicholes Calamity, [place]  HPI:  [name] is a 57 y.o.  moderately overweight married [place] female mother of 3 daughters referred by Dr. Betty [place] for cardiovascular evaluation because of hypertension and atypical chest pain. I last saw her in the office 04/10/17 . She moved to the [place] 35 years ago. She works at [employer] where she is exposed to chemicals and lifts heavy objects. Her cardiac risk factor profile is notable only for hypertension and family history. She says both of her parents had "stents". She has never had a heart attack or stroke. He does get occasional atypical chest pain. She's had a negative Myoview stress test remotely in [place]. She is fairly anxious and tearful today in the office. She is worried that she is "going to die". I have reassured her. I reviewed her blood pressure log and the most part her blood pressure is well controlled except for some isolated elevated diastolic blood pressures. She apparently was having side effects from lisinopril which she discontinued on her own and her blood pressures have been well well controlled despite this. She did have a Myoview stress test performed 01/10/17 which was entirely normal and since that time her chest pain has resolved spontaneously.   Since I saw her back approximately 8 months ago she has had some labile hypertension with associated headache and atypical chest pain. She is only on low-dose losartan which I'm going to titrate.    Current Meds  Medication Sig  . LORazepam (ATIVAN) 0.5 MG tablet Take 0.5-1 tablets (0.25-0.5 mg total) by mouth every 8 (eight) hours.  Marland Kitchen losartan (COZAAR) 25 MG tablet Take 1 tablet (25 mg total) by mouth daily.  Marland Kitchen omeprazole (PRILOSEC) 40 MG capsule Take 1 capsule (40 mg total) by mouth daily.  . sertraline (ZOLOFT) 25 MG tablet Take 1 tablet  (25 mg total) by mouth daily.     Allergies  Allergen Reactions  . Penicillins Rash    Has patient had a PCN reaction causing immediate rash, facial/tongue/throat swelling, SOB or lightheadedness with hypotension: No Has patient had a PCN reaction causing severe rash involving mucus membranes or skin necrosis: No Has patient had a PCN reaction that required hospitalization No Has patient had a PCN reaction occurring within the last 10 years: No If all of the above answers are "NO", then may proceed with Cephalosporin use.     Social History   Socioeconomic History  . Marital status: Married    Spouse name: Not on file  . Number of children: 3  . Years of education: Not on file  . Highest education level: Not on file  Social Needs  . Financial resource strain: Not on file  . Food insecurity - worry: Not on file  . Food insecurity - inability: Not on file  . Transportation needs - medical: Not on file  . Transportation needs - non-medical: Not on file  Occupational History  . Not on file  Tobacco Use  . Smoking status: Never Smoker  . Smokeless tobacco: Never Used  Substance and Sexual Activity  . Alcohol use: No  . Drug use: No  . Sexual activity: Yes  Other Topics Concern  . Not on file  Social History Narrative   Caffeine daily      Review of Systems: General: negative for chills, fever, night sweats  or weight changes.  Cardiovascular: negative for chest pain, dyspnea on exertion, edema, orthopnea, palpitations, paroxysmal nocturnal dyspnea or shortness of breath Dermatological: negative for rash Respiratory: negative for cough or wheezing Urologic: negative for hematuria Abdominal: negative for nausea, vomiting, diarrhea, bright red blood per rectum, melena, or hematemesis Neurologic: negative for visual changes, syncope, or dizziness All other systems reviewed and are otherwise negative except as noted above.    Blood pressure (!) 147/92, pulse 72, height 5'  2.5" (1.588 m), weight 146 lb 3.2 oz (66.3 kg), last menstrual period 12/21/2012.  General appearance: alert and no distress Neck: no adenopathy, no carotid bruit, no JVD, supple, symmetrical, trachea midline and thyroid not enlarged, symmetric, no tenderness/mass/nodules Lungs: clear to auscultation bilaterally Heart: regular rate and rhythm, S1, S2 normal, no murmur, click, rub or gallop Extremities: extremities normal, atraumatic, no cyanosis or edema Pulses: 2+ and symmetric Skin: Skin color, texture, turgor normal. No rashes or lesions Neurologic: Alert and oriented X 3, normal strength and tone. Normal symmetric reflexes. Normal coordination and gait  EKG sinus rhythm at 65 without ST or T-wave changes. I personally reviewed this EKG  ASSESSMENT AND PLAN:   Hypertension, essential History of essential hypertension with blood pressure 147/92. She is on low-dose losartan 25 mg a day which I'm going to increase to 50 mg a day. I've asked her to keep a blood pressure log 3 days and he see Belenda CruiseKristin back at that time to review her findings and titrate her medicines appropriately.  Atypical chest pain History of atypical chest pain with a negative Myoview stress test performed 01/10/17.  Hyperlipidemia History of hyperlipidemia not on statin therapy with lipid profile performed 08/10/17 revealing total cholesterol 157, LDL 96 and HDL of 53.      Runell GessJonathan J. Shelena Castelluccio MD FACP,FACC,FAHA, [facility] 12/28/2017 10:24 AM

## 2017-12-28 NOTE — Assessment & Plan Note (Signed)
History of atypical chest pain with a negative Myoview stress test performed 01/10/17.

## 2018-01-03 NOTE — Progress Notes (Signed)
HPI:   [name] is a 57 y.o. female, who is here today to follow on recent OV.   She was seen on 12/14/17.  Since her last OV she has followed with cardiologist,Dr Allyson SabalBerry on 12/28/17.   Hypertension:   Currently on Cozaar 50 mg daily.   She is still concerned because some BP numbers at home, mainly DBP.  She is taking medications as instructed.  She has not noted unusual headache, visual changes, exertional chest pain, dyspnea,  focal weakness, or edema.   Lab Results  Component Value Date   CREATININE 0.63 12/14/2017   BUN 12 12/14/2017   NA 138 12/14/2017   K 3.8 12/14/2017   CL 102 12/14/2017   CO2 32 12/14/2017    Anxiety: She was started on Sertraline 25 mg daily and Lorazepam 0.25-0.5 mg daily prn.  Lorazepam helps her relax.  She feels like her anxiety is better controlled. Not longer irritable or frustrated at work.  She denies suicidal thoughts.  She is still having traube falling asleep  Headache,"very bad", 2 hours after taking antihypertensive medication.  She wonders if headache is caused by Losartan but also tells me that she has had headache before medication was started. States that she has headache "all the time" Headache in the morning when she wakes up.  She has had headache for years and worse for the past year. Frontal parietal and sometimes occipital. She feels like "air is coming into my head."  Pressure "I do not know", "mostly every day." Pain is about 6-8/10. Occasionally she has nausea,"not often." She doe snot see "very well",she has not arranged an eye exam.  No associated vomiting. Ibuprofen does not help.  Reporting Hx of migraines.  Memory problems, "sometimes I get confuse."   She has followed ENT for frontal pressure headache and according to pt,she had head imaging.  She is also concerned about "heart pain"  Left chest pain,intermittent. She thinks it is her heart. She points of area of pain and  it is anterior to axilla. When she has pain she checks BP and usually elevated. Pain can be "bad." She cannot describe pain. It is not related with exertion.  Shoulder movement not limited.    Review of Systems  Constitutional: Positive for fatigue. Negative for activity change, appetite change and fever.  HENT: Positive for postnasal drip. Negative for mouth sores, nosebleeds, sore throat and trouble swallowing.   Eyes: Negative for photophobia and redness.  Respiratory: Negative for cough, shortness of breath and wheezing.   Cardiovascular: Negative for palpitations and leg swelling.  Gastrointestinal: Negative for abdominal pain, nausea and vomiting.       Negative for changes in bowel habits.  Genitourinary: Negative for decreased urine volume and hematuria.  Musculoskeletal: Positive for myalgias. Negative for gait problem.  Skin: Negative for pallor and rash.  Neurological: Positive for headaches. Negative for dizziness, seizures, syncope and weakness.  Hematological: Negative for adenopathy. Does not bruise/bleed easily.  Psychiatric/Behavioral: Positive for sleep disturbance. Negative for confusion. The patient is nervous/anxious.       Current Outpatient Medications on File Prior to Visit  Medication Sig Dispense Refill  . LORazepam (ATIVAN) 0.5 MG tablet Take 0.5-1 tablets (0.25-0.5 mg total) by mouth every 8 (eight) hours. 15 tablet 0  . losartan (COZAAR) 50 MG tablet Take 1 tablet (50 mg total) by mouth daily. 90 tablet 3  . omeprazole (PRILOSEC) 40 MG capsule Take 1 capsule (40 mg total) by  mouth daily. 30 capsule 0  . sertraline (ZOLOFT) 25 MG tablet Take 1 tablet (25 mg total) by mouth daily. 30 tablet 1   No current facility-administered medications on file prior to visit.      Past Medical History:  Diagnosis Date  . Anal fissure    hx of   . GERD (gastroesophageal reflux disease)   . H. pylori infection   . Hx: UTI (urinary tract infection)   .  Hyperlipidemia   . Hypertension   . Irritable bowel syndrome (IBS)    Allergies  Allergen Reactions  . Penicillins Rash    Has patient had a PCN reaction causing immediate rash, facial/tongue/throat swelling, SOB or lightheadedness with hypotension: No Has patient had a PCN reaction causing severe rash involving mucus membranes or skin necrosis: No Has patient had a PCN reaction that required hospitalization No Has patient had a PCN reaction occurring within the last 10 years: No If all of the above answers are "NO", then may proceed with Cephalosporin use.     Social History   Socioeconomic History  . Marital status: Married    Spouse name: None  . Number of children: 3  . Years of education: None  . Highest education level: None  Social Needs  . Financial resource strain: None  . Food insecurity - worry: None  . Food insecurity - inability: None  . Transportation needs - medical: None  . Transportation needs - non-medical: None  Occupational History  . None  Tobacco Use  . Smoking status: Never Smoker  . Smokeless tobacco: Never Used  Substance and Sexual Activity  . Alcohol use: No  . Drug use: No  . Sexual activity: Yes  Other Topics Concern  . None  Social History Narrative   Caffeine daily     Vitals:   01/04/18 0838  BP: 138/82  Pulse: 68  Resp: 12  Temp: 98.4 F (36.9 C)  SpO2: 98%   Body mass index is 26.82 kg/m.   Physical Exam  Nursing note and vitals reviewed. Constitutional: She is oriented to person, place, and time. She appears well-developed and well-nourished. No distress.  HENT:  Head: Normocephalic and atraumatic.  Mouth/Throat: Oropharynx is clear and moist and mucous membranes are normal.  Eyes: Conjunctivae and EOM are normal. Pupils are equal, round, and reactive to light.  Cardiovascular: Normal rate and regular rhythm.  No murmur heard. Pulses:      Dorsalis pedis pulses are 2+ on the right side, and 2+ on the left side.    Respiratory: Effort normal and breath sounds normal. No respiratory distress. She exhibits no tenderness.  GI: Soft. She exhibits no mass. There is no hepatomegaly. There is no tenderness.  Musculoskeletal: She exhibits no edema.       Thoracic back: She exhibits no tenderness and no bony tenderness.  Lymphadenopathy:    She has no cervical adenopathy.  Neurological: She is alert and oriented to person, place, and time. She has normal strength. No cranial nerve deficit. Gait normal.  Reflex Scores:      Patellar reflexes are 2+ on the right side and 2+ on the left side. Skin: Skin is warm. No erythema.  Psychiatric: Her mood appears anxious.  Well groomed, good eye contact.    ASSESSMENT AND PLAN:  Ms. Yolanda was seen today for follow-up.  Diagnoses and all orders for this visit:  Hypertension, essential  Improved. She is concerned about mildly elevated BP's but still does not want  to increase Losartan. Recommend monitoring BP when she is not having pain, which can caused BP elevation. Instructed about warning signs.  GAD (generalized anxiety disorder)  Improved but not well controlled still. She doe snot want to increase Sertraline. No changes in Lorazepam.  Headache, unspecified headache type  ? Tension headache. Also reporting Hx of migraine. Brain MRI will be arranged. She would like tp hold on neuro referral. Instructed about warning signs.  Other complicated headache syndrome -     MR Brain Wo Contrast; Future  Chest pain, unspecified type  She has had negative cardiac work up. Hx does not suggest cardiac etiology. Instructed about warning signs. She will continue following with cardiologist.   Somatization disorder   Multiple complaints. Work up otherwise negative. She is not interested in increasing Sertraline.  Strongly recommend psychotherapy.         Asir Bingley G. [place], MD  [facility]. [place]  office.

## 2018-01-04 ENCOUNTER — Ambulatory Visit (INDEPENDENT_AMBULATORY_CARE_PROVIDER_SITE_OTHER): Payer: Managed Care, Other (non HMO) | Admitting: Family Medicine

## 2018-01-04 ENCOUNTER — Encounter: Payer: Self-pay | Admitting: Family Medicine

## 2018-01-04 VITALS — BP 138/82 | HR 68 | Temp 98.4°F | Resp 12 | Ht 62.5 in | Wt 149.0 lb

## 2018-01-04 DIAGNOSIS — F450 Somatization disorder: Secondary | ICD-10-CM

## 2018-01-04 DIAGNOSIS — R079 Chest pain, unspecified: Secondary | ICD-10-CM

## 2018-01-04 DIAGNOSIS — R51 Headache: Secondary | ICD-10-CM | POA: Diagnosis not present

## 2018-01-04 DIAGNOSIS — I10 Essential (primary) hypertension: Secondary | ICD-10-CM | POA: Diagnosis not present

## 2018-01-04 DIAGNOSIS — F411 Generalized anxiety disorder: Secondary | ICD-10-CM

## 2018-01-04 DIAGNOSIS — G4459 Other complicated headache syndrome: Secondary | ICD-10-CM | POA: Diagnosis not present

## 2018-01-04 DIAGNOSIS — R519 Headache, unspecified: Secondary | ICD-10-CM | POA: Insufficient documentation

## 2018-01-04 NOTE — Patient Instructions (Addendum)
A few things to remember from today's visit:   Hypertension, essential  GAD (generalized anxiety disorder)  Headache, unspecified headache type  Other complicated headache syndrome - Plan: MR Brain Wo Contrast   Please be sure medication list is accurate. If a new problem present, please set up appointment sooner than planned today.

## 2018-02-01 ENCOUNTER — Ambulatory Visit: Payer: Managed Care, Other (non HMO)

## 2018-03-13 ENCOUNTER — Telehealth: Payer: Self-pay | Admitting: Family Medicine

## 2018-03-13 NOTE — Telephone Encounter (Signed)
Copied from CRM [id number]. Topic: Referral - Status >> Mar 13, 2018  8:47 AM Crist Infante wrote: Reason for CRM: Alinda Money with [facility] calling to advise the MRI brain has been denied Case no [id number]  [phone]  Peer to peer option only thing left to do

## 2018-03-13 NOTE — Telephone Encounter (Signed)
Message sent to Dr. Jordan for review. 

## 2018-03-15 NOTE — Telephone Encounter (Signed)
Please convey this information to patient. Headache did not seem worrisome but she was concerned. If she still having a headache we could arrange appointment with neurologist.  Thanks, BJ

## 2018-03-15 NOTE — Telephone Encounter (Signed)
Message sent to Dr. Jordan for review. 

## 2018-03-15 NOTE — Telephone Encounter (Signed)
I do not have an indication to check parathyroid.  But we can discuss this during her next visit.  Thanks, BJ

## 2018-03-15 NOTE — Telephone Encounter (Signed)
Patient says the head pain has resided but she would like tests for parathyroid.

## 2018-11-29 ENCOUNTER — Encounter: Payer: Self-pay | Admitting: Family Medicine

## 2018-11-29 ENCOUNTER — Ambulatory Visit (INDEPENDENT_AMBULATORY_CARE_PROVIDER_SITE_OTHER): Payer: Managed Care, Other (non HMO) | Admitting: Family Medicine

## 2018-11-29 VITALS — BP 125/77 | HR 69 | Temp 98.1°F | Resp 12 | Ht 62.5 in | Wt 162.2 lb

## 2018-11-29 DIAGNOSIS — R8290 Unspecified abnormal findings in urine: Secondary | ICD-10-CM | POA: Diagnosis not present

## 2018-11-29 DIAGNOSIS — R3129 Other microscopic hematuria: Secondary | ICD-10-CM | POA: Diagnosis not present

## 2018-11-29 DIAGNOSIS — R1032 Left lower quadrant pain: Secondary | ICD-10-CM | POA: Diagnosis not present

## 2018-11-29 DIAGNOSIS — N390 Urinary tract infection, site not specified: Secondary | ICD-10-CM

## 2018-11-29 LAB — POCT URINALYSIS DIPSTICK
Bilirubin, UA: NEGATIVE
GLUCOSE UA: NEGATIVE
KETONES UA: NEGATIVE
NITRITE UA: POSITIVE
PROTEIN UA: NEGATIVE
RBC UA: POSITIVE
SPEC GRAV UA: 1.015 (ref 1.010–1.025)
Urobilinogen, UA: 0.2 E.U./dL
pH, UA: 6 (ref 5.0–8.0)

## 2018-11-29 MED ORDER — NITROFURANTOIN MONOHYD MACRO 100 MG PO CAPS: 100.0000 mg | ORAL_CAPSULE | Freq: Two times a day (BID) | ORAL | 0 refills | Status: AC

## 2018-11-29 NOTE — Progress Notes (Signed)
HPI:  Chief Complaint  Patient presents with  . Abnormal urine odor    started 10 days ago    [name] is a 58 y.o. female, who is here today complaining of 10 days of urinary symptoms. Intermittent cloudy and odorous urine. She denies any other associated symptoms. She is not sure about exacerbating or alleviating factors.  Dysuria: Denies Urinary frequency: Denies Urinary urgency: Denies Incontinence: Denies Gross hematuria: Denies  Negative for fever, chills, change in appetite, or unusual fatigue.  Abdominal pain: LLQ pain that is started a few days ago.  She thinks it may be related with putting on muscle while exercising. She denies changes in bowel habits, she usually has 1-2 stools per day, no diarrhea or blood in the stool. Pain is intermittent, not radiated, and not sure about exacerbating or alleviating factors.  Nausea or vomiting: Negative  Abnormal vaginal bleeding or discharge: Negative  LMP: Menopausal. Sexual activity: Denies. Hx of atrophic vaginitis,dyspareunia. Hx of UTI: Yes, last one in 10/2018.States that every time she goes to her gyn she has a UTI. She doe snot remember abx she took.  OTC medications for this problem: None   Review of Systems  Constitutional: Negative for activity change, appetite change, fatigue and fever.  Gastrointestinal: Positive for abdominal pain. Negative for nausea and vomiting.       Negative for changes in bowel habits.  Genitourinary: Positive for dyspareunia. Negative for decreased urine volume, dysuria, flank pain, frequency, hematuria, urgency, vaginal bleeding and vaginal discharge.  Musculoskeletal: Negative for back pain and myalgias.      Current Outpatient Medications on File Prior to Visit  Medication Sig Dispense Refill  . LORazepam (ATIVAN) 0.5 MG tablet Take 0.5-1 tablets (0.25-0.5 mg total) by mouth every 8 (eight) hours. 15 tablet 0  . losartan (COZAAR) 50 MG tablet Take 1 tablet (50  mg total) by mouth daily. 90 tablet 3  . omeprazole (PRILOSEC) 40 MG capsule Take 1 capsule (40 mg total) by mouth daily. 30 capsule 0  . sertraline (ZOLOFT) 25 MG tablet Take 1 tablet (25 mg total) by mouth daily. 30 tablet 1   No current facility-administered medications on file prior to visit.      Past Medical History:  Diagnosis Date  . Anal fissure    hx of   . Anxiety   . GERD (gastroesophageal reflux disease)   . H. pylori infection   . Hx: UTI (urinary tract infection)   . Hyperlipidemia   . Hypertension   . Irritable bowel syndrome (IBS)    Allergies  Allergen Reactions  . Penicillins Rash    Has patient had a PCN reaction causing immediate rash, facial/tongue/throat swelling, SOB or lightheadedness with hypotension: No Has patient had a PCN reaction causing severe rash involving mucus membranes or skin necrosis: No Has patient had a PCN reaction that required hospitalization No Has patient had a PCN reaction occurring within the last 10 years: No If all of the above answers are "NO", then may proceed with Cephalosporin use.     Social History   Socioeconomic History  . Marital status: Married    Spouse name: Not on file  . Number of children: 3  . Years of education: Not on file  . Highest education level: Not on file  Occupational History  . Not on file  Social Needs  . Financial resource strain: Not on file  . Food insecurity:    Worry: Not on file  Inability: Not on file  . Transportation needs:    Medical: Not on file    Non-medical: Not on file  Tobacco Use  . Smoking status: Never Smoker  . Smokeless tobacco: Never Used  Substance and Sexual Activity  . Alcohol use: No  . Drug use: No  . Sexual activity: Yes  Lifestyle  . Physical activity:    Days per week: Not on file    Minutes per session: Not on file  . Stress: Not on file  Relationships  . Social connections:    Talks on phone: Not on file    Gets together: Not on file     Attends religious service: Not on file    Active member of club or organization: Not on file    Attends meetings of clubs or organizations: Not on file    Relationship status: Not on file  Other Topics Concern  . Not on file  Social History Narrative   Caffeine daily     Vitals:   11/29/18 1512  BP: 125/77  Pulse: 69  Resp: 12  Temp: 98.1 F (36.7 C)  SpO2: 97%   Body mass index is 29.2 kg/m.    Physical Exam  Nursing note and vitals reviewed. Constitutional: She is oriented to person, place, and time. She appears well-developed. No distress.  HENT:  Head: Normocephalic and atraumatic.  Mouth/Throat: Mucous membranes are normal.  Eyes: Conjunctivae are normal.  Cardiovascular: Normal rate and regular rhythm.  Respiratory: Effort normal and breath sounds normal. No respiratory distress.  GI: Soft. She exhibits no mass. There is no hepatomegaly. There is abdominal tenderness in the left lower quadrant. There is no rigidity, no rebound, no guarding and no CVA tenderness.  Musculoskeletal:        General: No edema.  Neurological: She is alert and oriented to person, place, and time. She has normal strength. Gait normal.  Skin: Skin is warm. No rash noted. No erythema.  Psychiatric: Her speech is normal. Her mood appears anxious.  Well groomed,good eye contact.    ASSESSMENT AND PLAN:  [name] was seen today for abnormal urine odor.  Diagnoses and all orders for this visit:  Abnormal urine odor -     POC Urinalysis Dipstick -     Urine culture  Bad odor of urine Possible etiology discussed. Negative for dysuria or frequency, I am not certain symptoms are caused by UTI. Urine culture sent  -     Urine culture -     nitrofurantoin, macrocrystal-monohydrate, (MACROBID) 100 MG capsule; Take 1 capsule (100 mg total) by mouth 2 (two) times daily for 5 days.  Urinary tract infection without hematuria, site unspecified Empiric abx treatment started today and will be  tailored according to Ucx results and susceptibility report.  Clearly instructed about warning signs. F/U if symptoms persist.  -     Urine culture -     nitrofurantoin, macrocrystal-monohydrate, (MACROBID) 100 MG capsule; Take 1 capsule (100 mg total) by mouth 2 (two) times daily for 5 days.  LLQ abdominal pain We discussed possible etiologies. Examination today does not suggest an acute abdomen. ?  Musculoskeletal, diverticulosis,IBS/constipation among some. I do not think imaging is needed today. She was clearly instructed about warning signs.  Other microscopic hematuria Urine dipstick done today  + leuk and presence of blood. Urine will be sent for microscopic and further recommendation will be given accordingly.  -     Urine Microscopic Only  She asked for me to go back to room after AVS was printed and visit completed. According to patient, Dr. Lorenza ChickNail prescribed hormonal therapy, she has not started yet because she is afraid of side effects. She would like to know how safe a statin medication and if medication will help with vaginal dryness and hot flashes.  Very anxious about risk of breast cancer.  25 min face to face OV. > 50% was dedicated to discussion of Dx, prognosis, treatment options, and side effects of Estradiol. I also recommend addressing all her concerns with Dr. Lorenza ChickNail.    Return if symptoms worsen or fail to improve.    Betty G. [place], MD  [facility]. [place] office.

## 2018-11-29 NOTE — Patient Instructions (Signed)
A few things to remember from today's visit:   Abnormal urine odor - Plan: POC Urinalysis Dipstick, Urine culture  Bad odor of urine - Plan: Urine culture, nitrofurantoin, macrocrystal-monohydrate, (MACROBID) 100 MG capsule  Urinary tract infection without hematuria, site unspecified - Plan: Urine culture, nitrofurantoin, macrocrystal-monohydrate, (MACROBID) 100 MG capsule    Adequate fluid intake, avoid holding urine for long hours, and over the counter Vit C OR cranberry capsules might help.  Today we will treat empirically with antibiotic, which we might need to change when urine culture comes back depending of bacteria susceptibility.  Seek immediate medical attention if severe abdominal pain, vomiting, fever/chills, or worsening symptoms. F/U if symptomatic are not any better after 2-3 days of antibiotic treatment.  Please be sure medication list is accurate. If a new problem present, please set up appointment sooner than planned today.

## 2018-12-01 LAB — URINE CULTURE
MICRO NUMBER: 101569
SPECIMEN QUALITY: ADEQUATE

## 2019-04-25 ENCOUNTER — Other Ambulatory Visit: Payer: Self-pay | Admitting: Family Medicine

## 2019-04-25 DIAGNOSIS — R0789 Other chest pain: Secondary | ICD-10-CM

## 2019-04-25 NOTE — Telephone Encounter (Signed)
Pt called in to follow up on refill request and to also request a refill on her LORazepam (ATIVAN) 0.5 MG tablet.   Pt says that this is her first refill on medications since she started taking them so shes not sure if a visit is needed.     Pharmacy:  [facility] [address] [place] - [place] [address] [phone] (Phone) [phone] (Fax)     Please advise

## 2019-04-29 NOTE — Telephone Encounter (Signed)
Last f/u appt 01/2018. Seen in 11/2018 for urine odor. Needs f/u appt.  Thanks, BJ

## 2019-06-04 ENCOUNTER — Telehealth: Payer: Self-pay | Admitting: Family Medicine

## 2019-06-04 NOTE — Telephone Encounter (Signed)
Patient needs a refill on Losartan 25 mg. Patient is out of medication.  Pharmacy: [facility] [address]

## 2019-06-05 ENCOUNTER — Other Ambulatory Visit: Payer: Self-pay

## 2019-06-05 DIAGNOSIS — R0789 Other chest pain: Secondary | ICD-10-CM

## 2019-06-05 MED ORDER — LOSARTAN POTASSIUM 50 MG PO TABS: 50.0000 mg | ORAL_TABLET | Freq: Every day | ORAL | 3 refills | Status: DC

## 2019-06-05 NOTE — Telephone Encounter (Signed)
Rx has been refilled. No further action needed!

## 2019-09-10 ENCOUNTER — Encounter: Payer: Self-pay | Admitting: Family Medicine

## 2019-09-10 ENCOUNTER — Other Ambulatory Visit: Payer: Self-pay

## 2019-09-10 ENCOUNTER — Ambulatory Visit (INDEPENDENT_AMBULATORY_CARE_PROVIDER_SITE_OTHER): Payer: Managed Care, Other (non HMO) | Admitting: Family Medicine

## 2019-09-10 VITALS — BP 150/84 | HR 66 | Temp 97.8°F | Resp 16 | Ht 62.5 in | Wt 179.4 lb

## 2019-09-10 DIAGNOSIS — Z130 Encounter for screening for diseases of the blood and blood-forming organs and certain disorders involving the immune mechanism: Secondary | ICD-10-CM | POA: Diagnosis not present

## 2019-09-10 DIAGNOSIS — Z1329 Encounter for screening for other suspected endocrine disorder: Secondary | ICD-10-CM | POA: Diagnosis not present

## 2019-09-10 DIAGNOSIS — Z23 Encounter for immunization: Secondary | ICD-10-CM | POA: Diagnosis not present

## 2019-09-10 DIAGNOSIS — I10 Essential (primary) hypertension: Secondary | ICD-10-CM | POA: Diagnosis not present

## 2019-09-10 DIAGNOSIS — Z0000 Encounter for general adult medical examination without abnormal findings: Secondary | ICD-10-CM

## 2019-09-10 DIAGNOSIS — Z13228 Encounter for screening for other metabolic disorders: Secondary | ICD-10-CM | POA: Diagnosis not present

## 2019-09-10 DIAGNOSIS — E785 Hyperlipidemia, unspecified: Secondary | ICD-10-CM

## 2019-09-10 DIAGNOSIS — Z1159 Encounter for screening for other viral diseases: Secondary | ICD-10-CM

## 2019-09-10 DIAGNOSIS — Z9189 Other specified personal risk factors, not elsewhere classified: Secondary | ICD-10-CM

## 2019-09-10 LAB — LIPID PANEL
Cholesterol: 204 mg/dL — ABNORMAL HIGH (ref 0–200)
HDL: 55.5 mg/dL (ref >39.00)
LDL Cholesterol: 130 mg/dL — ABNORMAL HIGH (ref 0–99)
NonHDL: 148.92
Total CHOL/HDL Ratio: 4
Triglycerides: 95 mg/dL (ref 0.0–149.0)
VLDL: 19 mg/dL (ref 0.0–40.0)

## 2019-09-10 LAB — COMPREHENSIVE METABOLIC PANEL
ALT: 14 U/L (ref 0–35)
AST: 13 U/L (ref 0–37)
Albumin: 4 g/dL (ref 3.5–5.2)
Alkaline Phosphatase: 77 U/L (ref 39–117)
BUN: 10 mg/dL (ref 6–23)
CO2: 26 mEq/L (ref 19–32)
Calcium: 9.3 mg/dL (ref 8.4–10.5)
Chloride: 98 mEq/L (ref 96–112)
Creatinine, Ser: 0.59 mg/dL (ref 0.40–1.20)
GFR: 105.39 mL/min (ref >60.00)
Glucose, Bld: 89 mg/dL (ref 70–99)
Potassium: 3.7 mEq/L (ref 3.5–5.1)
Sodium: 137 mEq/L (ref 135–145)
Total Bilirubin: 0.6 mg/dL (ref 0.2–1.2)
Total Protein: 6.7 g/dL (ref 6.0–8.3)

## 2019-09-10 LAB — HEMOGLOBIN A1C: Hgb A1c MFr Bld: 5.5 % (ref 4.6–6.5)

## 2019-09-10 NOTE — Progress Notes (Signed)
HPI:   [name] is a 58 y.o. female, who is here today for her routine physical.  Last CPE: 08/2018. States that she also had a C[place] country in 09[place]2018. She had cardiac work up,imaging done "scans.",cervcial and lower back imaging,and blood work. "Everything was fine",except for some "stopts" in liver,she was reassured.  Regular exercise 3 or more time per week: She has not been consistent since 01/2019. She is working from home and more sedentary now. Following a healthy diet: Yes. She lives with her husband.  Chronic medical problems: HTN,GERD,anxiety,allergies, and OSA among some.  Pap smear: 2017, today she has appt with her gyn at 11:00 Am  Immunization History  Administered Date(s) Administered  . Td 08/21/2008  . Tdap 09/10/2019    Mammogram: 2017. Colonoscopy: 10/16/11. DEXA: N/A  Hep C screening: Not done before.  HTN: Today BP mildly elevated. She does not monitor BP at home because aggravates anxiety. She is on Losartan 25 mg daily.  She is not checking BP at home.  Review of Systems  Constitutional: Positive for fatigue. Negative for appetite change and fever.  HENT: Negative for hearing loss, mouth sores, sore throat and trouble swallowing.   Eyes: Negative for redness and visual disturbance.  Respiratory: Negative for cough, shortness of breath and wheezing.   Cardiovascular: Negative for chest pain and leg swelling.  Gastrointestinal: Negative for abdominal pain, nausea and vomiting.       No changes in bowel habits.  Endocrine: Negative for cold intolerance, heat intolerance, polydipsia, polyphagia and polyuria.  Genitourinary: Negative for decreased urine volume, dysuria, hematuria, vaginal bleeding and vaginal discharge.  Musculoskeletal: Positive for arthralgias and back pain. Negative for gait problem.  Skin: Negative for color change and rash.  Allergic/Immunologic: Positive for environmental allergies.  Neurological:  Negative for syncope and weakness.  Psychiatric/Behavioral: Positive for sleep disturbance. Negative for confusion. The patient is nervous/anxious.   All other systems reviewed and are negative.   Current Outpatient Medications on File Prior to Visit  Medication Sig Dispense Refill  . losartan (COZAAR) 50 MG tablet Take 1 tablet (50 mg total) by mouth daily. 90 tablet 3  . omeprazole (PRILOSEC) 40 MG capsule Take 1 capsule (40 mg total) by mouth daily. 30 capsule 0   No current facility-administered medications on file prior to visit.      Past Medical History:  Diagnosis Date  . Anal fissure    hx of   . Anxiety   . GERD (gastroesophageal reflux disease)   . H. pylori infection   . Hx: UTI (urinary tract infection)   . Hyperlipidemia   . Hypertension   . Irritable bowel syndrome (IBS)     Past Surgical History:  Procedure Laterality Date  . ABDOMINAL HYSTERECTOMY  10/2016    Allergies  Allergen Reactions  . Penicillins Rash    Has patient had a PCN reaction causing immediate rash, facial/tongue/throat swelling, SOB or lightheadedness with hypotension: No Has patient had a PCN reaction causing severe rash involving mucus membranes or skin necrosis: No Has patient had a PCN reaction that required hospitalization No Has patient had a PCN reaction occurring within the last 10 years: No If all of the above answers are "NO", then may proceed with Cephalosporin use.     Family History  Problem Relation Age of Onset  . Diabetes Mother   . Heart disease Mother   . Stroke Mother   . Hypertension Mother   .  Heart disease Father   . Colon cancer Neg Hx     Social History   Socioeconomic History  . Marital status: Married    Spouse name: Not on file  . Number of children: 3  . Years of education: Not on file  . Highest education level: Not on file  Occupational History  . Not on file  Social Needs  . Financial resource strain: Not on file  . Food insecurity     Worry: Not on file    Inability: Not on file  . Transportation needs    Medical: Not on file    Non-medical: Not on file  Tobacco Use  . Smoking status: Never Smoker  . Smokeless tobacco: Never Used  Substance and Sexual Activity  . Alcohol use: No  . Drug use: No  . Sexual activity: Yes  Lifestyle  . Physical activity    Days per week: Not on file    Minutes per session: Not on file  . Stress: Not on file  Relationships  . Social Musician on phone: Not on file    Gets together: Not on file    Attends religious service: Not on file    Active member of club or organization: Not on file    Attends meetings of clubs or organizations: Not on file    Relationship status: Not on file  Other Topics Concern  . Not on file  Social History Narrative   Caffeine daily      Vitals:   09/10/19 0857  BP: (!) 150/84  Pulse: 66  Resp: 16  Temp: 97.8 F (36.6 C)  SpO2: 99%   Body mass index is 32.29 kg/m.   Wt Readings from Last 3 Encounters:  09/10/19 179 lb 6.4 oz (81.4 kg)  11/29/18 162 lb 4 oz (73.6 kg)  01/04/18 149 lb (67.6 kg)    Physical Exam  Nursing note and vitals reviewed. Constitutional: She is oriented to person, place, and time. She appears well-developed. No distress.  HENT:  Head: Normocephalic and atraumatic.  Right Ear: Hearing, tympanic membrane, external ear and ear canal normal.  Left Ear: Hearing, tympanic membrane, external ear and ear canal normal.  Mouth/Throat: Uvula is midline, oropharynx is clear and moist and mucous membranes are normal.  Eyes: Pupils are equal, round, and reactive to light. Conjunctivae and EOM are normal.  Neck: No tracheal deviation present. No thyromegaly present.  Cardiovascular: Normal rate and regular rhythm.  No murmur heard. Pulses:      Dorsalis pedis pulses are 2+ on the right side and 2+ on the left side.  Respiratory: Effort normal and breath sounds normal. No respiratory distress.  GI: Soft. She  exhibits no mass. There is no hepatomegaly. There is no abdominal tenderness.  Genitourinary:    Genitourinary Comments: Deferred to gyn.   Musculoskeletal:        General: No edema.     Comments: No major deformity or signs of synovitis appreciated.  Lymphadenopathy:    She has no cervical adenopathy.       Right: No supraclavicular adenopathy present.       Left: No supraclavicular adenopathy present.  Neurological: She is alert and oriented to person, place, and time. She has normal strength. No cranial nerve deficit. Coordination and gait normal.  Reflex Scores:      Bicep reflexes are 2+ on the right side and 2+ on the left side.      Patellar reflexes  are 2+ on the right side and 2+ on the left side. Skin: Skin is warm. No rash noted. No erythema.  Psychiatric: Her mood appears anxious.  Fairly groomed, good eye contact.    ASSESSMENT AND PLAN:  [name] was here today annual physical examination.  Orders Placed This Encounter  Procedures  . Tdap vaccine greater than or equal to 7yo IM  . Comprehensive metabolic panel  . Lipid panel  . Hemoglobin A1c  . Hepatitis C antibody screen   Lab Results  Component Value Date   HGBA1C 5.5 09/10/2019   Lab Results  Component Value Date   CHOL 204 (H) 09/10/2019   HDL 55.50 09/10/2019   LDLCALC 130 (H) 09/10/2019   LDLDIRECT 132.4 08/21/2008   LDLDIRECT 132.4 08/21/2008   TRIG 95.0 09/10/2019   CHOLHDL 4 09/10/2019   Lab Results  Component Value Date   ALT 14 09/10/2019   AST 13 09/10/2019   ALKPHOS 77 09/10/2019   BILITOT 0.6 09/10/2019   Lab Results  Component Value Date   CREATININE 0.59 09/10/2019   BUN 10 09/10/2019   NA 137 09/10/2019   K 3.7 09/10/2019   CL 98 09/10/2019   CO2 26 09/10/2019    Routine general medical examination at a health care facility We discussed the importance of regular physical activity and healthy diet for prevention of chronic illness and/or complications. Preventive  guidelines reviewed. Vaccination up to date.Refused flu vaccine.  Ca++ and vit D supplementation to continue. Next CPE in a year.  The 10-year ASCVD risk score Mikey Bussing DC Brooke Bonito., et al., 2013) is: 4.1%   Values used to calculate the score:     Age: 72 years     Sex: Female     Is Non-Hispanic African American: No     Diabetic: No     Tobacco smoker: No     Systolic Blood Pressure: 628 mmHg     Is BP treated: Yes     HDL Cholesterol: 55.5 mg/dL     Total Cholesterol: 204 mg/dL  Hypertension, essential Elevated BP today. Possible complications of elevated BP discussed. Because it aggravates anxiety,I am not recommending checking BP at homr. Low salt diet to continue.  Encounter for HCV screening test for high risk patient -     Hepatitis C antibody screen  Hyperlipidemia, unspecified hyperlipidemia type Continue non pharmacologic treatment.  -     Lipid panel  Screening for endocrine, metabolic and immunity disorder -     Comprehensive metabolic panel -     Hemoglobin A1c  Need for Tdap vaccination -     Tdap vaccine greater than or equal to 7yo IM   Return in 3 months (on 12/11/2019) for HTN.   Kalmen Lollar G. Martinique, MD  [facility]. [place] office.

## 2019-09-10 NOTE — Patient Instructions (Signed)
Today you have you routine preventive visit. A few things to remember from today's visit:   Routine general medical examination at a health care facility  Hypertension, essential  Encounter for HCV screening test for high risk patient - Plan: Hepatitis C antibody screen  Screening for lipoid disorders - Plan: Lipid panel  Screening for endocrine, metabolic and immunity disorder - Plan: Comprehensive metabolic panel, Hemoglobin A1c   At least 150 minutes of moderate exercise per week, daily brisk walking for 15-30 min is a good exercise option. Healthy diet low in saturated (animal) fats and sweets and consisting of fresh fruits and vegetables, lean meats such as fish and white chicken and whole grains.  These are some of recommendations for screening depending of age and risk factors:   - Vaccines:  Tdap vaccine every 10 years.  Shingles vaccine recommended at age 1, could be given after 56 years of age but not sure about insurance coverage.   Pneumonia vaccines:  Prevnar 13 at 65 and Pneumovax at 38. Sometimes Pneumovax is giving earlier if history of smoking, lung disease,diabetes,kidney disease among some. Screening for diabetes at age 53 and every 3 years.  Cervical cancer prevention:  Pap smear every 3-5 years between women 54 and older if pap smear negative and HPV screening negative.  -Breast cancer: Mammogram: There is disagreement between experts about when to start screening in low risk asymptomatic female but recent recommendations are to start screening at 11 and not later than 56 years old , every 1-2 years and after 56 yo q 2 years. Screening is recommended until 56 years old but some women can continue screening depending of healthy issues.  Colon cancer screening: starts at 56 years old until 56 years old.  Cholesterol disorder screening at age 14 and every 3 years.  Also recommended:  1. Dental visit- Brush and floss your teeth twice daily; visit your  dentist twice a year. 2. Eye doctor- Get an eye exam at least every 2 years. 3. Helmet use- Always wear a helmet when riding a bicycle, motorcycle, rollerblading or skateboarding. 4. Safe sex- If you may be exposed to sexually transmitted infections, use a condom. 5. Seat belts- Seat belts can save your live; always wear one. 6. Smoke/Carbon Monoxide detectors- These detectors need to be installed on the appropriate level of your home. Replace batteries at least once a year. 7. Skin cancer- When out in the sun please cover up and use sunscreen 15 SPF or higher. 8. Violence- If anyone is threatening or hurting you, please tell your healthcare provider.  9. Drink alcohol in moderation- Limit alcohol intake to one drink or less per day. Never drink and drive.   Your blood pressure is elevated, so increase dose of losartan from 25 mg to 50 mg. Monitor your blood pressure regularly.   How to Take Your Blood Pressure You can take your blood pressure at home with a machine. You may need to check your blood pressure at home:  To check if you have high blood pressure (hypertension).  To check your blood pressure over time.  To make sure your blood pressure medicine is working. Supplies needed: You will need a blood pressure machine, or monitor. You can buy one at a drugstore or online. When choosing one:  Choose one with an arm cuff.  Choose one that wraps around your upper arm. Only one finger should fit between your arm and the cuff.  Do not choose one that measures your blood  pressure from your wrist or finger. Your doctor can suggest a monitor. How to prepare Avoid these things for 30 minutes before checking your blood pressure:  Drinking caffeine.  Drinking alcohol.  Eating.  Smoking.  Exercising. Five minutes before checking your blood pressure:  Pee.  Sit in a dining chair. Avoid sitting in a soft couch or armchair.  Be quiet. Do not talk. How to take your blood  pressure Follow the instructions that came with your machine. If you have a digital blood pressure monitor, these may be the instructions: 1. Sit up straight. 2. Place your feet on the floor. Do not cross your ankles or legs. 3. Rest your left arm at the level of your heart. You may rest it on a table, desk, or chair. 4. Pull up your shirt sleeve. 5. Wrap the blood pressure cuff around the upper part of your left arm. The cuff should be 1 inch (2.5 cm) above your elbow. It is best to wrap the cuff around bare skin. 6. Fit the cuff snugly around your arm. You should be able to place only one finger between the cuff and your arm. 7. Put the cord inside the groove of your elbow. 8. Press the power button. 9. Sit quietly while the cuff fills with air and loses air. 10. Write down the numbers on the screen. 11. Wait 2-3 minutes and then repeat steps 1-10. What do the numbers mean? Two numbers make up your blood pressure. The first number is called systolic pressure. The second is called diastolic pressure. An example of a blood pressure reading is "120 over 80" (or 120/80). If you are an adult and do not have a medical condition, use this guide to find out if your blood pressure is normal: Normal  First number: below 120.  Second number: below 80. Elevated  First number: 120-129.  Second number: below 80. Hypertension stage 1  First number: 130-139.  Second number: 80-89. Hypertension stage 2  First number: 140 or above.  Second number: 90 or above. Your blood pressure is above normal even if only the top or bottom number is above normal. Follow these instructions at home:  Check your blood pressure as often as your doctor tells you to.  Take your monitor to your next doctor's appointment. Your doctor will: ? Make sure you are using it correctly. ? Make sure it is working right.  Make sure you understand what your blood pressure numbers should be.  Tell your doctor if your  medicines are causing side effects. Contact a doctor if:  Your blood pressure keeps being high. Get help right away if:  Your first blood pressure number is higher than 180.  Your second blood pressure number is higher than 120. This information is not intended to replace advice given to you by your health care provider. Make sure you discuss any questions you have with your health care provider. Document Released: 10/05/2008 Document Revised: 10/05/2017 Document Reviewed: 03/31/2016 Elsevier Patient Education  2020 [employer].

## 2019-09-11 LAB — HEPATITIS C ANTIBODY
Hepatitis C Ab: NONREACTIVE
SIGNAL TO CUT-OFF: 0.01 (ref <1.00)

## 2019-09-16 ENCOUNTER — Telehealth: Payer: Self-pay | Admitting: Family Medicine

## 2019-09-16 NOTE — Telephone Encounter (Signed)
Result note read to patient, verbalizes understanding. Pt would like Dr. Martinique to know she had her mammogram today and results were normal. Also states she will have PAP tomorrow; wanted Dr. Martinique to know she will request records be sent to practice.  Pt requests copy of labs be mailed to her home address; address verified.  TE as result note was not routed to [facility].

## 2020-03-22 ENCOUNTER — Other Ambulatory Visit: Payer: Self-pay

## 2020-03-23 ENCOUNTER — Encounter: Payer: Self-pay | Admitting: Family Medicine

## 2020-03-23 ENCOUNTER — Ambulatory Visit (INDEPENDENT_AMBULATORY_CARE_PROVIDER_SITE_OTHER): Payer: Managed Care, Other (non HMO) | Admitting: Family Medicine

## 2020-03-23 VITALS — BP 130/90 | HR 60 | Temp 97.2°F | Resp 12 | Ht 62.5 in | Wt 182.0 lb

## 2020-03-23 DIAGNOSIS — R002 Palpitations: Secondary | ICD-10-CM | POA: Insufficient documentation

## 2020-03-23 DIAGNOSIS — I10 Essential (primary) hypertension: Secondary | ICD-10-CM | POA: Diagnosis not present

## 2020-03-23 DIAGNOSIS — K769 Liver disease, unspecified: Secondary | ICD-10-CM | POA: Diagnosis not present

## 2020-03-23 DIAGNOSIS — R0789 Other chest pain: Secondary | ICD-10-CM

## 2020-03-23 DIAGNOSIS — F411 Generalized anxiety disorder: Secondary | ICD-10-CM

## 2020-03-23 MED ORDER — AMLODIPINE BESYLATE 2.5 MG PO TABS: 2.5000 mg | ORAL_TABLET | Freq: Every day | ORAL | 0 refills | Status: DC

## 2020-03-23 NOTE — Assessment & Plan Note (Addendum)
This problem is chronic. She is going to need to wear a heart monitor for a few days to evaluate for arrhythmias. HR on lower normal range, so BB is not a good option at this time. Later prn Propranolol can be considered after ruling out other conditions. I think problem is most likely aggravated by anxiety. Instructed about warning signs. Will follow with cardiologist.

## 2020-03-23 NOTE — Assessment & Plan Note (Signed)
Today BP otherwise adequate controlled. She is reporting higher DBP's. After discussion pharmacologic treatment options,she would like to try low dose amlodipine, 2.5 mg at bedtime. Low salt diet. Continue monitoring BP. She is going to follow with cardiologist, so I will see her in 3 months.

## 2020-03-23 NOTE — Patient Instructions (Addendum)
A few things to remember from today's visit:   Today we are going to do some labs. Referral placed as requested. Continue monitoring blood pressure. Amlodipine 2.5 mg at bedtime started today.   If you need refills please call your pharmacy. Do not use My Chart to request refills or for acute issues that need immediate attention.    Please be sure medication list is accurate. If a new problem present, please set up appointment sooner than planned today.

## 2020-03-23 NOTE — Assessment & Plan Note (Signed)
Multiple concerns despite of mostly negative work up. She is not interested in pharmacologic or CBT.

## 2020-03-23 NOTE — Progress Notes (Addendum)
[name] is a 59 y.o.female, who is here today to follow on HTN. He has a few concerns today. Cannot sleep. Feeing cold at night (face and UE's). Joint pain. Wt gain. She would like iron check.  Concerned about low iron or thyroid disease. States that she wants another test to check for thyroid problems, she does not want TSH.She was on the phone part of the visit asking which test should be ordered.  -Last visit, 09/10/2019, BP was elevated at 150/84. She was on losartan 50 mg daily for HTN. Medications was causing palpitations, so she discontinued.  Reporting BP elevated at 180/110 after COVID 19 vaccine She has not noted unusual/frequent headache,visual changes,dyspnea,or edema.  Home BP readings: 130-140/95-99.  -Left-sided chest discomfort. She has difficulty describing type of pain and distribution. She points of left upper chest,neck, and shoulder. No hx of trauma. It happens mainly at night. No symptoms with exertion.  She feels like her "heart is pushing very hard to pump blood out." Negative for orthopnea,PND,or edema.  Lab Results  Component Value Date   TSH 0.71 12/13/2016   She has not identified exacerbating. Alleviated by breathing exercises.  Negative for heartburn.  She is very worried about possible heart disease. Cardiolite stress test in 08/2016 and 01/10/17. Echo in 12/2016 and 10/2017.  She would like to see cardiologist. She has seen Dr Allyson Sabal in the past for palpitations and chest discomfort. She would like to see a cardiologist at [facility].   Lab Results  Component Value Date   CREATININE 0.59 09/10/2019   BUN 10 09/10/2019   NA 137 09/10/2019   K 3.7 09/10/2019   CL 98 09/10/2019   CO2 26 09/10/2019   -States that back home ([place]) she had test done "head to toe" 2 years ago. Negative except for liver lesions.  According to pt,it was recommended to have "liver CT" every 6 months to monitor for changes.  She shows me  report on her phone: 51 mm x 41 mm and 45 mm x25 mm hepatic lesions, hemangiomas. Tiny cyst also reported. Lab Results  Component Value Date   ALT 14 09/10/2019   AST 13 09/10/2019   ALKPHOS 77 09/10/2019   BILITOT 0.6 09/10/2019    She thinks she looks "pale" and thinks this is related to liver disease.  Review of Systems  Constitutional: Positive for fatigue.  HENT: Negative for mouth sores, nosebleeds and sore throat.   Eyes: Negative for redness and visual disturbance.  Respiratory: Negative for cough and wheezing.   Genitourinary: Negative for decreased urine volume, dysuria and hematuria.  Musculoskeletal: Positive for arthralgias and neck pain. Negative for joint swelling.  Skin: Negative for rash and wound.  Allergic/Immunologic: Positive for environmental allergies.  Neurological: Negative for syncope, facial asymmetry and speech difficulty.  Hematological: Negative for adenopathy. Does not bruise/bleed easily.  Psychiatric/Behavioral: Positive for sleep disturbance. Negative for confusion and hallucinations. The patient is nervous/anxious.   Rest see pertinent positives and negatives per HPI.  Current Outpatient Medications on File Prior to Visit  Medication Sig Dispense Refill  . losartan (COZAAR) 50 MG tablet Take 1 tablet (50 mg total) by mouth daily. (Patient not taking: Reported on 03/23/2020) 90 tablet 3  . omeprazole (PRILOSEC) 40 MG capsule Take 1 capsule (40 mg total) by mouth daily. (Patient not taking: Reported on 03/23/2020) 30 capsule 0   No current facility-administered medications on file prior to visit.   Past Medical History:  Diagnosis Date  .  Anal fissure    hx of   . Anxiety   . GERD (gastroesophageal reflux disease)   . H. pylori infection   . Hx: UTI (urinary tract infection)   . Hyperlipidemia   . Hypertension   . Irritable bowel syndrome (IBS)     Allergies  Allergen Reactions  . Penicillins Rash    Has patient had a PCN reaction causing  immediate rash, facial/tongue/throat swelling, SOB or lightheadedness with hypotension: No Has patient had a PCN reaction causing severe rash involving mucus membranes or skin necrosis: No Has patient had a PCN reaction that required hospitalization No Has patient had a PCN reaction occurring within the last 10 years: No If all of the above answers are "NO", then may proceed with Cephalosporin use.     Social History   Socioeconomic History  . Marital status: Married    Spouse name: Not on file  . Number of children: 3  . Years of education: Not on file  . Highest education level: Not on file  Occupational History  . Not on file  Tobacco Use  . Smoking status: Never Smoker  . Smokeless tobacco: Never Used  Substance and Sexual Activity  . Alcohol use: No  . Drug use: No  . Sexual activity: Yes  Other Topics Concern  . Not on file  Social History Narrative   Caffeine daily    Social Determinants of Health   Financial Resource Strain:   . Difficulty of Paying Living Expenses:   Food Insecurity:   . Worried About Programme researcher, broadcasting/film/video in the Last Year:   . Barista in the Last Year:   Transportation Needs:   . Freight forwarder (Medical):   Marland Kitchen Lack of Transportation (Non-Medical):   Physical Activity:   . Days of Exercise per Week:   . Minutes of Exercise per Session:   Stress:   . Feeling of Stress :   Social Connections:   . Frequency of Communication with Friends and Family:   . Frequency of Social Gatherings with Friends and Family:   . Attends Religious Services:   . Active Member of Clubs or Organizations:   . Attends Banker Meetings:   Marland Kitchen Marital Status:     Vitals:   03/23/20 1411 03/23/20 1522  BP: 130/90   Pulse: (!) 43 60  Resp: 12   Temp: (!) 97.2 F (36.2 C)   SpO2: 99%    Body mass index is 32.76 kg/m.  Physical Exam  Nursing note and vitals reviewed. Constitutional: She is oriented to person, place, and time. She  appears well-developed. No distress.  HENT:  Head: Normocephalic and atraumatic.  Mouth/Throat: Oropharynx is clear and moist and mucous membranes are normal.  Eyes: Pupils are equal, round, and reactive to light. Conjunctivae are normal.  Cardiovascular: Normal rate and regular rhythm.  No murmur heard. Pulses:      Dorsalis pedis pulses are 2+ on the right side and 2+ on the left side.  Respiratory: Effort normal and breath sounds normal. No respiratory distress. She exhibits no tenderness.  GI: Soft. She exhibits no mass. There is no hepatomegaly. There is no abdominal tenderness.  Musculoskeletal:        General: No edema.     Left shoulder: No tenderness or bony tenderness. Normal range of motion.     Cervical back: No tenderness or bony tenderness.     Thoracic back: No tenderness or bony tenderness.  Lymphadenopathy:    She has no cervical adenopathy.  Neurological: She is alert and oriented to person, place, and time. She has normal strength. No cranial nerve deficit. Gait normal.  Skin: Skin is warm. No rash noted. No erythema.  Psychiatric: Her mood appears anxious.  Well groomed, good eye contact.   ASSESSMENT AND PLAN:   [name] was seen today for medication problem, hypertension and chest pain.  Diagnoses and all orders for this visit:  Orders Placed This Encounter  Procedures  . CT LIVER ABDOMEN W WO CONTRAST  . Basic metabolic panel  . Hepatic function panel  . TSH  . CBC  . Ambulatory referral to Cardiology  . EKG 12-Lead   Lab Results  Component Value Date   ALT 19 03/23/2020   AST 21 03/23/2020   ALKPHOS 70 03/23/2020   BILITOT 0.4 03/23/2020   Lab Results  Component Value Date   WBC 7.2 03/23/2020   HGB 12.8 03/23/2020   HCT 37.6 03/23/2020   MCV 92.2 03/23/2020   PLT 294.0 03/23/2020   Lab Results  Component Value Date   TSH 0.90 03/23/2020   Lab Results  Component Value Date   CREATININE 0.63 03/23/2020   BUN 10 03/23/2020   NA 135  03/23/2020   K 3.5 03/23/2020   CL 97 03/23/2020   CO2 30 03/23/2020   Liver lesion Tried to reassured, lesions seem to be hemangiomas.  I do not think we need an abdominal CT every 6 months. She is very anxious, so abdominal/liver CT will be arranged.  Atypical chest pain She has had negative work up. Risk factors HTN and FHX of CAD. EKG today:HR 60/min, normal axis and intervals. No significant changes when compared with EKG 12/28/17. Instructed about warning signs.  Hypertension, essential Today BP otherwise adequate controlled. She is reporting higher DBP's. After discussion pharmacologic treatment options,she would like to try low dose amlodipine, 2.5 mg at bedtime. Low salt diet. Continue monitoring BP. She is going to follow with cardiologist, so I will see her in 3 months.  GAD (generalized anxiety disorder) Multiple concerns despite of mostly negative work up. She is not interested in pharmacologic or CBT.   Heart palpitations This problem is chronic. She is going to need to wear a heart monitor for a few days to evaluate for arrhythmias. HR on lower normal range, so BB is not a good option at this time. Later prn Propranolol can be considered after ruling out other conditions. I think problem is most likely aggravated by anxiety. Instructed about warning signs. Will follow with cardiologist.    40 min face to face OV. > 50% was dedicated to discussion of all differential Dx, prognosis, treatment options, and some side effects of medications. We reviewed concerns during prior visits and cardiac tests done so far.  Nataya Bastedo G. [place], MD  [facility]. [place] office.

## 2020-03-24 LAB — HEPATIC FUNCTION PANEL
ALT: 19 U/L (ref 0–35)
AST: 21 U/L (ref 0–37)
Albumin: 4.2 g/dL (ref 3.5–5.2)
Alkaline Phosphatase: 70 U/L (ref 39–117)
Bilirubin, Direct: 0.1 mg/dL (ref 0.0–0.3)
Total Bilirubin: 0.4 mg/dL (ref 0.2–1.2)
Total Protein: 7.3 g/dL (ref 6.0–8.3)

## 2020-03-24 LAB — CBC
HCT: 37.6 % (ref 36.0–46.0)
Hemoglobin: 12.8 g/dL (ref 12.0–15.0)
MCHC: 33.9 g/dL (ref 30.0–36.0)
MCV: 92.2 fl (ref 78.0–100.0)
Platelets: 294 10*3/uL (ref 150.0–400.0)
RBC: 4.08 Mil/uL (ref 3.87–5.11)
RDW: 13 % (ref 11.5–15.5)
WBC: 7.2 10*3/uL (ref 4.0–10.5)

## 2020-03-24 LAB — BASIC METABOLIC PANEL
BUN: 10 mg/dL (ref 6–23)
CO2: 30 mEq/L (ref 19–32)
Calcium: 9.5 mg/dL (ref 8.4–10.5)
Chloride: 97 mEq/L (ref 96–112)
Creatinine, Ser: 0.63 mg/dL (ref 0.40–1.20)
GFR: 97.52 mL/min (ref >60.00)
Glucose, Bld: 78 mg/dL (ref 70–99)
Potassium: 3.5 mEq/L (ref 3.5–5.1)
Sodium: 135 mEq/L (ref 135–145)

## 2020-03-24 LAB — TSH: TSH: 0.9 u[IU]/mL (ref 0.35–4.50)

## 2020-07-13 ENCOUNTER — Other Ambulatory Visit: Payer: Self-pay

## 2020-07-13 ENCOUNTER — Encounter: Payer: Self-pay | Admitting: Family Medicine

## 2020-07-13 ENCOUNTER — Ambulatory Visit (INDEPENDENT_AMBULATORY_CARE_PROVIDER_SITE_OTHER): Payer: Managed Care, Other (non HMO) | Admitting: Family Medicine

## 2020-07-13 VITALS — BP 130/90 | HR 80 | Ht 62.5 in | Wt 177.0 lb

## 2020-07-13 DIAGNOSIS — N390 Urinary tract infection, site not specified: Secondary | ICD-10-CM | POA: Diagnosis not present

## 2020-07-13 DIAGNOSIS — R1032 Left lower quadrant pain: Secondary | ICD-10-CM

## 2020-07-13 DIAGNOSIS — R3989 Other symptoms and signs involving the genitourinary system: Secondary | ICD-10-CM | POA: Diagnosis not present

## 2020-07-13 DIAGNOSIS — R8290 Unspecified abnormal findings in urine: Secondary | ICD-10-CM | POA: Diagnosis not present

## 2020-07-13 DIAGNOSIS — R319 Hematuria, unspecified: Secondary | ICD-10-CM

## 2020-07-13 LAB — POCT URINALYSIS DIPSTICK
Bilirubin, UA: NEGATIVE
Blood, UA: POSITIVE
Glucose, UA: NEGATIVE
Ketones, UA: NEGATIVE
Nitrite, UA: NEGATIVE
Protein, UA: NEGATIVE
Spec Grav, UA: 1.01 (ref 1.010–1.025)
Urobilinogen, UA: 1 E.U./dL
pH, UA: 7 (ref 5.0–8.0)

## 2020-07-13 MED ORDER — CIPROFLOXACIN HCL 500 MG PO TABS: 500.0000 mg | ORAL_TABLET | Freq: Two times a day (BID) | ORAL | 0 refills | Status: DC

## 2020-07-13 MED ORDER — METRONIDAZOLE 500 MG PO TABS: 500.0000 mg | ORAL_TABLET | Freq: Three times a day (TID) | ORAL | 0 refills | Status: DC

## 2020-07-13 NOTE — Progress Notes (Signed)
Established Patient Office Visit  Subjective:  Patient ID: [name], female    DOB: [birth year]  Age: 59 y.o. MRN: [id number]  CC: No chief complaint on file.   HPI [name] presents for increased urine odor for the past few days.  She noticed predominantly some left lower quadrant abdominal pain.  This seemed to get worse over the weekend.  She is not had any fever.  No nausea or vomiting.  No stool changes.  No history of kidney stones.  She had colonoscopy 2012 which showed diverticulosis changes sigmoid colon.  She is not aware of any history of diverticulitis.  Her pain is somewhat worse with movement.  No alleviating factors.  She was concerned that she may have urinary infection.  She is not currently sexually active.  Denies any injury.  Past Medical History:  Diagnosis Date  . Anal fissure    hx of   . Anxiety   . GERD (gastroesophageal reflux disease)   . H. pylori infection   . Hx: UTI (urinary tract infection)   . Hyperlipidemia   . Hypertension   . Irritable bowel syndrome (IBS)     Past Surgical History:  Procedure Laterality Date  . ABDOMINAL HYSTERECTOMY  10/2016    Family History  Problem Relation Age of Onset  . Diabetes Mother   . Heart disease Mother   . Stroke Mother   . Hypertension Mother   . Heart disease Father   . Colon cancer Neg Hx     Social History   Socioeconomic History  . Marital status: Married    Spouse name: Not on file  . Number of children: 3  . Years of education: Not on file  . Highest education level: Not on file  Occupational History  . Not on file  Tobacco Use  . Smoking status: Never Smoker  . Smokeless tobacco: Never Used  Substance and Sexual Activity  . Alcohol use: No  . Drug use: No  . Sexual activity: Yes  Other Topics Concern  . Not on file  Social History Narrative   Caffeine daily    Social Determinants of Health   Financial Resource Strain:   . Difficulty of Paying Living Expenses: Not on  file  Food Insecurity:   . Worried About Programme researcher, broadcasting/film/video in the Last Year: Not on file  . Ran Out of Food in the Last Year: Not on file  Transportation Needs:   . Lack of Transportation (Medical): Not on file  . Lack of Transportation (Non-Medical): Not on file  Physical Activity:   . Days of Exercise per Week: Not on file  . Minutes of Exercise per Session: Not on file  Stress:   . Feeling of Stress : Not on file  Social Connections:   . Frequency of Communication with Friends and Family: Not on file  . Frequency of Social Gatherings with Friends and Family: Not on file  . Attends Religious Services: Not on file  . Active Member of Clubs or Organizations: Not on file  . Attends Banker Meetings: Not on file  . Marital Status: Not on file  Intimate Partner Violence:   . Fear of Current or Ex-Partner: Not on file  . Emotionally Abused: Not on file  . Physically Abused: Not on file  . Sexually Abused: Not on file    Outpatient Medications Prior to Visit  Medication Sig Dispense Refill  . amLODipine (NORVASC) 2.5 MG tablet Take 1  tablet (2.5 mg total) by mouth daily. 90 tablet 0  . losartan (COZAAR) 50 MG tablet Take 1 tablet (50 mg total) by mouth daily. 90 tablet 3  . omeprazole (PRILOSEC) 40 MG capsule Take 1 capsule (40 mg total) by mouth daily. 30 capsule 0   No facility-administered medications prior to visit.    Allergies  Allergen Reactions  . Penicillins Rash    Has patient had a PCN reaction causing immediate rash, facial/tongue/throat swelling, SOB or lightheadedness with hypotension: No Has patient had a PCN reaction causing severe rash involving mucus membranes or skin necrosis: No Has patient had a PCN reaction that required hospitalization No Has patient had a PCN reaction occurring within the last 10 years: No If all of the above answers are "NO", then may proceed with Cephalosporin use.     ROS Review of Systems  Constitutional: Negative  for chills and fever.  Respiratory: Negative for shortness of breath.   Cardiovascular: Negative for chest pain.  Gastrointestinal: Positive for abdominal pain. Negative for blood in stool, constipation, diarrhea, nausea and vomiting.  Genitourinary: Negative for flank pain and hematuria.      Objective:    Physical Exam Vitals reviewed.  Constitutional:      General: She is not in acute distress.    Appearance: Normal appearance. She is not toxic-appearing.  Cardiovascular:     Rate and Rhythm: Normal rate and regular rhythm.  Pulmonary:     Effort: Pulmonary effort is normal.     Breath sounds: Normal breath sounds.  Abdominal:     Palpations: Abdomen is soft.     Tenderness: There is abdominal tenderness.     Comments: She has tenderness in the left lower quadrant.  No masses palpated  Neurological:     Mental Status: She is alert.     BP 130/90   Pulse 80   Ht 5' 2.5" (1.588 m)   Wt 177 lb (80.3 kg)   LMP 12/21/2012   SpO2 98%   BMI 31.86 kg/m  Wt Readings from Last 3 Encounters:  07/13/20 177 lb (80.3 kg)  03/23/20 182 lb (82.6 kg)  09/10/19 179 lb 6.4 oz (81.4 kg)     Health Maintenance Due  Topic Date Due  . COVID-19 Vaccine (1) Never done  . PAP SMEAR-Modifier  07/28/2019    There are no preventive care reminders to display for this patient.  Lab Results  Component Value Date   TSH 0.90 03/23/2020   Lab Results  Component Value Date   WBC 7.2 03/23/2020   HGB 12.8 03/23/2020   HCT 37.6 03/23/2020   MCV 92.2 03/23/2020   PLT 294.0 03/23/2020   Lab Results  Component Value Date   NA 135 03/23/2020   K 3.5 03/23/2020   CO2 30 03/23/2020   GLUCOSE 78 03/23/2020   BUN 10 03/23/2020   CREATININE 0.63 03/23/2020   BILITOT 0.4 03/23/2020   ALKPHOS 70 03/23/2020   AST 21 03/23/2020   ALT 19 03/23/2020   PROT 7.3 03/23/2020   ALBUMIN 4.2 03/23/2020   CALCIUM 9.5 03/23/2020   GFR 97.52 03/23/2020   Lab Results  Component Value Date    CHOL 204 (H) 09/10/2019   Lab Results  Component Value Date   HDL 55.50 09/10/2019   Lab Results  Component Value Date   LDLCALC 130 (H) 09/10/2019   Lab Results  Component Value Date   TRIG 95.0 09/10/2019   Lab Results  Component Value Date  CHOLHDL 4 09/10/2019   Lab Results  Component Value Date   HGBA1C 5.5 09/10/2019      Assessment & Plan:   Patient presents with few day history of left lower quadrant abdominal pain and change in urine appearance.  She does have some leukocytes on exam of urine dipstick but clinically suspect she may have acute diverticulitis.  She does have known diverticular changes from colonoscopy 2012 in the sigmoid colon.  She is nontoxic in appearance.  No fever.  -Urine culture sent -Check CBC with differential -Start Cipro 500 mg twice daily for 10 days and Flagyl 500 mg 3 times daily for 10 days -Consider CT scan abdomen pelvis for any persistent or worsening symptoms -She knows to go to the ER for any high fever, worsening abdominal pain, or recurrent vomiting  Meds ordered this encounter  Medications  . ciprofloxacin (CIPRO) 500 MG tablet    Sig: Take 1 tablet (500 mg total) by mouth 2 (two) times daily.    Dispense:  20 tablet    Refill:  0  . metroNIDAZOLE (FLAGYL) 500 MG tablet    Sig: Take 1 tablet (500 mg total) by mouth 3 (three) times daily.    Dispense:  30 tablet    Refill:  0    Follow-up: No follow-ups on file.    Evelena Peat, MD

## 2020-07-13 NOTE — Patient Instructions (Addendum)
Diverticulitis  Diverticulitis is infection or inflammation of small pouches (diverticula) in the colon that form due to a condition called diverticulosis. Diverticula can trap stool (feces) and bacteria, causing infection and inflammation. Diverticulitis may cause severe stomach pain and diarrhea. It may lead to tissue damage in the colon that causes bleeding. The diverticula may also burst (rupture) and cause infected stool to enter other areas of the abdomen. Complications of diverticulitis can include:  Bleeding.  Severe infection.  Severe pain.  Rupture (perforation) of the colon.  Blockage (obstruction) of the colon. What are the causes? This condition is caused by stool becoming trapped in the diverticula, which allows bacteria to grow in the diverticula. This leads to inflammation and infection. What increases the risk? You are more likely to develop this condition if:  You have diverticulosis. The risk for diverticulosis increases if: ? You are overweight or obese. ? You use tobacco products. ? You do not get enough exercise.  You eat a diet that does not include enough fiber. High-fiber foods include fruits, vegetables, beans, nuts, and whole grains. What are the signs or symptoms? Symptoms of this condition may include:  Pain and tenderness in the abdomen. The pain is normally located on the left side of the abdomen, but it may occur in other areas.  Fever and chills.  Bloating.  Cramping.  Nausea.  Vomiting.  Changes in bowel routines.  Blood in your stool. How is this diagnosed? This condition is diagnosed based on:  Your medical history.  A physical exam.  Tests to make sure there is nothing else causing your condition. These tests may include: ? Blood tests. ? Urine tests. ? Imaging tests of the abdomen, including X-rays, ultrasounds, MRIs, or CT scans. How is this treated? Most cases of this condition are mild and can be treated at home.  Treatment may include:  Taking over-the-counter pain medicines.  Following a clear liquid diet.  Taking antibiotic medicines by mouth.  Rest. More severe cases may need to be treated at a hospital. Treatment may include:  Not eating or drinking.  Taking prescription pain medicine.  Receiving antibiotic medicines through an IV tube.  Receiving fluids and nutrition through an IV tube.  Surgery. When your condition is under control, your health care provider may recommend that you have a colonoscopy. This is an exam to look at the entire large intestine. During the exam, a lubricated, bendable tube is inserted into the anus and then passed into the rectum, colon, and other parts of the large intestine. A colonoscopy can show how severe your diverticula are and whether something else may be causing your symptoms. Follow these instructions at home: Medicines  Take over-the-counter and prescription medicines only as told by your health care provider. These include fiber supplements, probiotics, and stool softeners.  If you were prescribed an antibiotic medicine, take it as told by your health care provider. Do not stop taking the antibiotic even if you start to feel better.  Do not drive or use heavy machinery while taking prescription pain medicine. General instructions   Follow a full liquid diet or another diet as directed by your health care provider. After your symptoms improve, your health care provider may tell you to change your diet. He or she may recommend that you eat a diet that contains at least 25 g (25 grams) of fiber daily. Fiber makes it easier to pass stool. Healthy sources of fiber include: ? Berries. One cup contains 4-8 grams of   fiber. ? Beans or lentils. One half cup contains 5-8 grams of fiber. ? Green vegetables. One cup contains 4 grams of fiber.  Exercise for at least 30 minutes, 3 times each week. You should exercise hard enough to raise your heart rate and  break a sweat.  Keep all follow-up visits as told by your health care provider. This is important. You may need a colonoscopy. Contact a health care provider if:  Your pain does not improve.  You have a hard time drinking or eating food.  Your bowel movements do not return to normal. Get help right away if:  Your pain gets worse.  Your symptoms do not get better with treatment.  Your symptoms suddenly get worse.  You have a fever.  You vomit more than one time.  You have stools that are bloody, black, or tarry. Summary  Diverticulitis is infection or inflammation of small pouches (diverticula) in the colon that form due to a condition called diverticulosis. Diverticula can trap stool (feces) and bacteria, causing infection and inflammation.  You are at higher risk for this condition if you have diverticulosis and you eat a diet that does not include enough fiber.  Most cases of this condition are mild and can be treated at home. More severe cases may need to be treated at a hospital.  When your condition is under control, your health care provider may recommend that you have an exam called a colonoscopy. This exam can show how severe your diverticula are and whether something else may be causing your symptoms. This information is not intended to replace advice given to you by your health care provider. Make sure you discuss any questions you have with your health care provider. Document Revised: 10/05/2017 Document Reviewed: 11/25/2016 Elsevier Patient Education  2020 [employer] Inc[employer]  Follow up promptly for any fever, vomiting, or worsening pain.

## 2020-07-15 ENCOUNTER — Telehealth: Payer: Self-pay | Admitting: Family Medicine

## 2020-07-15 LAB — CBC WITH DIFFERENTIAL/PLATELET
Absolute Monocytes: 911 cells/uL (ref 200–950)
Basophils Absolute: 39 cells/uL (ref 0–200)
Basophils Relative: 0.4 %
Eosinophils Absolute: 127 cells/uL (ref 15–500)
Eosinophils Relative: 1.3 %
HCT: 35.7 % (ref 35.0–45.0)
Hemoglobin: 11.7 g/dL (ref 11.7–15.5)
Lymphs Abs: 1891 cells/uL (ref 850–3900)
MCH: 30.2 pg (ref 27.0–33.0)
MCHC: 32.8 g/dL (ref 32.0–36.0)
MCV: 92 fL (ref 80.0–100.0)
MPV: 9.8 fL (ref 7.5–12.5)
Monocytes Relative: 9.3 %
Neutro Abs: 6831 cells/uL (ref 1500–7800)
Neutrophils Relative %: 69.7 %
Platelets: 312 10*3/uL (ref 140–400)
RBC: 3.88 10*6/uL (ref 3.80–5.10)
RDW: 12.1 % (ref 11.0–15.0)
Total Lymphocyte: 19.3 %
WBC: 9.8 10*3/uL (ref 3.8–10.8)

## 2020-07-15 LAB — URINE CULTURE
MICRO NUMBER:: 10917763
SPECIMEN QUALITY:: ADEQUATE

## 2020-07-15 NOTE — Telephone Encounter (Signed)
Pt returned your call.  

## 2020-09-02 ENCOUNTER — Telehealth: Payer: Self-pay | Admitting: Family Medicine

## 2020-09-02 NOTE — Telephone Encounter (Signed)
Pt is calling in stating that she had her 1st COVID vaccine [employer]) 02/27/2020(at the [facility]) had a serious reaction to the vaccine rash (around wrist line), itching, passed out (for about 4 hrs or more woke up with EMS around her) and blood pressure was extremely high and she stated several days afterwards she was lightheaded and dizzy.  Was suppose to get the 2nd one 03/19/2020 but did not get due to the reaction of the first one.  Pt would like to have a work note recommending that she does not take the 2nd dose of the COVID vaccine due to the reaction that she had from the first one, so that she does not lose her job.  Pt stated that she is scared to take the 2nd vaccine.

## 2020-09-03 ENCOUNTER — Telehealth (INDEPENDENT_AMBULATORY_CARE_PROVIDER_SITE_OTHER): Payer: Managed Care, Other (non HMO) | Admitting: Family Medicine

## 2020-09-03 ENCOUNTER — Encounter: Payer: Self-pay | Admitting: Family Medicine

## 2020-09-03 VITALS — Ht 62.5 in

## 2020-09-03 DIAGNOSIS — R1032 Left lower quadrant pain: Secondary | ICD-10-CM

## 2020-09-03 DIAGNOSIS — R55 Syncope and collapse: Secondary | ICD-10-CM

## 2020-09-03 DIAGNOSIS — I10 Essential (primary) hypertension: Secondary | ICD-10-CM | POA: Diagnosis not present

## 2020-09-03 DIAGNOSIS — F411 Generalized anxiety disorder: Secondary | ICD-10-CM | POA: Diagnosis not present

## 2020-09-03 MED ORDER — AMLODIPINE BESYLATE 2.5 MG PO TABS: 2.5000 mg | ORAL_TABLET | Freq: Every day | ORAL | 2 refills | Status: DC

## 2020-09-03 NOTE — Telephone Encounter (Signed)
Spoke with health department, waiting on a call back.

## 2020-09-03 NOTE — Telephone Encounter (Signed)
Can you see if pt can do a VV to discuss her reaction with Dr. [place] today at 4:30? The health department hasn't called me back & i'm not sure when they will.

## 2020-09-03 NOTE — Telephone Encounter (Signed)
LMVM for the patient to contact the office to schedule a virtual appointment today.

## 2020-09-03 NOTE — Progress Notes (Signed)
Virtual Visit via Video Note I connected with [name] on 09/03/20 by a video enabled telemedicine application and verified that I am speaking with the correct person using two identifiers.  Location patient: home Location provider:work office Persons participating in the virtual visit: patient, provider. Her daughter was in the room but she did not participate during the interview.  I discussed the limitations of evaluation and management by telemedicine and the availability of in person appointments. The patient expressed understanding and agreed to proceed.  HPI: [name] is a 59 yo female with hx of somatization disorder,anxiety,HTN,and HLD requesting a letter for employer recommending against 2nd dose of COVID 19 vaccination. Hx is not clear. States that right after vaccination she felt dizzy. Evaluated by paramedics on site. Reporting BP "180/200", she "king of passed out." She was not sent to the ER. States that she was in observation for 30 min, she felt better and let her go home. She states that she did not check BP at home after event.  She is on Amlodipine 2.5 mg daily. She is tolerating medication well. Negative for unusual headache, visual changes,SOB,CP She is also reporting severe generalized pruritus and wrist rash that lasted 2 days.  She received COVID 19 vaccine in 02/2020. I saw her on 03/23/20, when she reported that her BP was elevated at 180/110 after COVID 19 vaccine. She did not mention these other symptoms.  She is also requesting referral to GI to have a colonoscopy. She is having some abdominal pain, LLQ, and bloating sensation. She was seen on 07/13/20 and treated as diverticulitis. She did not complete abx treatment because it made her sick. Negative fot dysuria or gross hematuria. No changes in bowel habits. Hx of IBS. Last colonoscopy in 2012, 10-year follow-up was recommended.  ROS: See pertinent positives and negatives per HPI.  Past Medical  History:  Diagnosis Date  . Anal fissure    hx of   . Anxiety   . GERD (gastroesophageal reflux disease)   . H. pylori infection   . Hx: UTI (urinary tract infection)   . Hyperlipidemia   . Hypertension   . Irritable bowel syndrome (IBS)    Past Surgical History:  Procedure Laterality Date  . ABDOMINAL HYSTERECTOMY  10/2016   Family History  Problem Relation Age of Onset  . Diabetes Mother   . Heart disease Mother   . Stroke Mother   . Hypertension Mother   . Heart disease Father   . Colon cancer Neg Hx     Social History   Socioeconomic History  . Marital status: Married    Spouse name: Not on file  . Number of children: 3  . Years of education: Not on file  . Highest education level: Not on file  Occupational History  . Not on file  Tobacco Use  . Smoking status: Never Smoker  . Smokeless tobacco: Never Used  Substance and Sexual Activity  . Alcohol use: No  . Drug use: No  . Sexual activity: Yes  Other Topics Concern  . Not on file  Social History Narrative   Caffeine daily    Social Determinants of Health   Financial Resource Strain:   . Difficulty of Paying Living Expenses: Not on file  Food Insecurity:   . Worried About Programme researcher, broadcasting/film/video in the Last Year: Not on file  . Ran Out of Food in the Last Year: Not on file  Transportation Needs:   . Lack of Transportation (  Medical): Not on file  . Lack of Transportation (Non-Medical): Not on file  Physical Activity:   . Days of Exercise per Week: Not on file  . Minutes of Exercise per Session: Not on file  Stress:   . Feeling of Stress : Not on file  Social Connections:   . Frequency of Communication with Friends and Family: Not on file  . Frequency of Social Gatherings with Friends and Family: Not on file  . Attends Religious Services: Not on file  . Active Member of Clubs or Organizations: Not on file  . Attends Banker Meetings: Not on file  . Marital Status: Not on file  Intimate  Partner Violence:   . Fear of Current or Ex-Partner: Not on file  . Emotionally Abused: Not on file  . Physically Abused: Not on file  . Sexually Abused: Not on file    Current Outpatient Medications:  .  amLODipine (NORVASC) 2.5 MG tablet, Take 1 tablet (2.5 mg total) by mouth daily., Disp: 90 tablet, Rfl: 2  EXAM:  VITALS per patient if applicable:Ht 5' 2.5" (1.588 m)   LMP 12/21/2012   BMI 31.86 kg/m  Tried to check BP but BP monitor did not have batteries. GENERAL: alert, oriented, appears well and in no acute distress  HEENT: atraumatic, conjunctiva clear, no obvious abnormalities on inspection.  NECK: normal movements of the head and neck  LUNGS: on inspection no signs of respiratory distress, breathing rate appears normal, no obvious gross SOB, gasping or wheezing  CV: no obvious cyanosis  Deborah: moves all visible extremities without noticeable abnormality  PSYCH/NEURO: pleasant and cooperative, no obvious depression,she is anxious, speech and thought processing grossly intact  ASSESSMENT AND PLAN:  Discussed the following assessment and plan:  Syncope, unspecified syncope type According to pt, this happened after COVID 19 vaccination. She did not have an ER evaluation. Hx is ambiguus. She was educated about rinks of not completing COVID 19 vaccination. Letter with symptoms she is describing and attributed to vaccine reaction, sp she can be excused from taking the 2nd dose.   Hypertension, essential - Plan: amLODipine (NORVASC) 2.5 MG tablet Recommend monitoring BP regularly. Continue Amlodipine 2.5 mg daily.  Abdominal pain, LLQ Colonoscopy done in 10/2011 by Dr Russella Dar and 10 years f/u recommended. Before placing referral she wants to find out if he is still in her network.  GAD (generalized anxiety disorder) She is not on pharmacologic treatment. This problem could be contributing to some of her complaints.  I discussed the assessment and treatment plan with  the patient. [name] was provided an opportunity to ask questions and all were answered. She agreed with the plan and demonstrated an understanding of the instructions.    Return in about 6 months (around 03/04/2021) for HTN.  Eriana Suliman [place], MD

## 2020-09-03 NOTE — Telephone Encounter (Signed)
I need note from the ER visit or report to evaluate if it was a real allergic reaction, in which case, we can issue a letter. Thanks, BJ

## 2020-09-06 ENCOUNTER — Telehealth: Payer: Self-pay | Admitting: Family Medicine

## 2020-09-06 DIAGNOSIS — Z1211 Encounter for screening for malignant neoplasm of colon: Secondary | ICD-10-CM

## 2020-09-06 NOTE — Telephone Encounter (Signed)
Referral has been entered.

## 2020-09-06 NOTE — Telephone Encounter (Signed)
Pt is calling in stating that her insurance is in network with Dr. Claudette Head (gastroenterologist) for her colonoscopy and would like to move forward with the referral.

## 2020-09-17 ENCOUNTER — Other Ambulatory Visit: Payer: Self-pay

## 2020-09-17 DIAGNOSIS — I10 Essential (primary) hypertension: Secondary | ICD-10-CM

## 2020-09-17 MED ORDER — AMLODIPINE BESYLATE 2.5 MG PO TABS: 2.5000 mg | ORAL_TABLET | Freq: Every day | ORAL | 2 refills | Status: DC

## 2020-09-27 ENCOUNTER — Encounter: Payer: Managed Care, Other (non HMO) | Admitting: Gastroenterology

## 2020-10-27 ENCOUNTER — Ambulatory Visit: Payer: Managed Care, Other (non HMO) | Admitting: Gastroenterology

## 2021-02-23 ENCOUNTER — Telehealth: Payer: Self-pay | Admitting: Family Medicine

## 2021-02-23 ENCOUNTER — Telehealth: Payer: Managed Care, Other (non HMO) | Admitting: Family Medicine

## 2021-02-23 NOTE — Telephone Encounter (Signed)
Needs appointment before anything could be prescribed.

## 2021-02-23 NOTE — Telephone Encounter (Signed)
  Pt tested positive for Covid yesterday. Pt took a home test. Pt going to be retested at [facility]  today and would like to know if  Dr. [place] could  prescribe medication for her covid symptoms.    [id number] - [place], [place] - [address] AT [address] OF [facility] [address]  Phone:[phone] Fax:  [phone]

## 2021-02-23 NOTE — Telephone Encounter (Signed)
LMVM for the patient to contact the office to schedule a virtual appointment

## 2021-02-25 ENCOUNTER — Encounter: Payer: Self-pay | Admitting: Adult Health

## 2021-02-25 ENCOUNTER — Telehealth (INDEPENDENT_AMBULATORY_CARE_PROVIDER_SITE_OTHER): Payer: Managed Care, Other (non HMO) | Admitting: Adult Health

## 2021-02-25 DIAGNOSIS — R112 Nausea with vomiting, unspecified: Secondary | ICD-10-CM | POA: Diagnosis not present

## 2021-02-25 DIAGNOSIS — U071 COVID-19: Secondary | ICD-10-CM | POA: Diagnosis not present

## 2021-02-25 MED ORDER — ONDANSETRON HCL 4 MG PO TABS: 4.0000 mg | ORAL_TABLET | Freq: Three times a day (TID) | ORAL | 0 refills | Status: DC | PRN

## 2021-02-25 NOTE — Progress Notes (Signed)
Virtual Visit via Video Note  I connected with [name]  on 02/25/21 at 11:00 AM EDT by a video enabled telemedicine application and verified that I am speaking with the correct person using two identifiers.  Location patient: home Location provider:work or home office Persons participating in the virtual visit: patient, provider  I discussed the limitations of evaluation and management by telemedicine and the availability of in person appointments. The patient expressed understanding and agreed to proceed.   HPI: 60 year old female who  has a past medical history of Anal fissure, Anxiety, GERD (gastroesophageal reflux disease), H. pylori infection, UTI (urinary tract infection), Hyperlipidemia, Hypertension, and Irritable bowel syndrome (IBS).  She is being evaluated today for COVID-19 infection.  She reports that she tested positive earlier today, symptoms started a few days prior.  Symptoms include cough, vomiting, diarrhea, body aches, runny nose, sore throat, subjective fever and chills.  She denies shortness of breath.  She has been using Advil at home to help with her fever and body aches.    Her pulse ox currently is 98% with a pulse of 85.   ROS: See pertinent positives and negatives per HPI.  Past Medical History:  Diagnosis Date  . Anal fissure    hx of   . Anxiety   . GERD (gastroesophageal reflux disease)   . H. pylori infection   . Hx: UTI (urinary tract infection)   . Hyperlipidemia   . Hypertension   . Irritable bowel syndrome (IBS)     Past Surgical History:  Procedure Laterality Date  . ABDOMINAL HYSTERECTOMY  10/2016    Family History  Problem Relation Age of Onset  . Diabetes Mother   . Heart disease Mother   . Stroke Mother   . Hypertension Mother   . Heart disease Father   . Colon cancer Neg Hx        Current Outpatient Medications:  .  amLODipine (NORVASC) 2.5 MG tablet, Take 1 tablet (2.5 mg total) by mouth daily., Disp: 90 tablet, Rfl:  2  EXAM:  VITALS per patient if applicable:  GENERAL: alert, oriented, appears tired but  in no acute distress  HEENT: atraumatic, conjunttiva clear, no obvious abnormalities on inspection of external nose and ears  NECK: normal movements of the head and neck  LUNGS: on inspection no signs of respiratory distress, breathing rate appears normal, no obvious gross SOB, gasping or wheezing  CV: no obvious cyanosis  MS: moves all visible extremities without noticeable abnormality  PSYCH/NEURO: pleasant and cooperative, no obvious depression or anxiety, speech and thought processing grossly intact  ASSESSMENT AND PLAN:  Discussed the following assessment and plan:  1. COVID-19 virus infection -Discussed various treatment options including oral antivirals.  She refuses antiviral medication at this time, would like to continue with over-the-counter medication but would be happy with something to help with nausea and vomiting.  She was advised to use Tylenol as well as Motrin for fever and body aches, rest as much as possible and stay hydrated.  If her symptoms worsen over the weekend then go to the emergency room.  2. Intractable vomiting with nausea, unspecified vomiting type  - ondansetron (ZOFRAN) 4 MG tablet; Take 1 tablet (4 mg total) by mouth every 8 (eight) hours as needed for nausea, vomiting or refractory nausea / vomiting.  Dispense: 20 tablet; Refill: 0     I discussed the assessment and treatment plan with the patient. The patient was provided an opportunity to ask  questions and all were answered. The patient agreed with the plan and demonstrated an understanding of the instructions.   The patient was advised to call back or seek an in-person evaluation if the symptoms worsen or if the condition fails to improve as anticipated.   Deborah Peng, NP

## 2021-05-23 ENCOUNTER — Other Ambulatory Visit: Payer: Self-pay

## 2021-05-24 ENCOUNTER — Ambulatory Visit (INDEPENDENT_AMBULATORY_CARE_PROVIDER_SITE_OTHER): Payer: Managed Care, Other (non HMO) | Admitting: Family Medicine

## 2021-05-24 ENCOUNTER — Encounter: Payer: Self-pay | Admitting: Family Medicine

## 2021-05-24 VITALS — BP 128/80 | HR 93 | Temp 97.7°F | Resp 16 | Ht 62.5 in | Wt 189.0 lb

## 2021-05-24 DIAGNOSIS — E669 Obesity, unspecified: Secondary | ICD-10-CM | POA: Diagnosis not present

## 2021-05-24 DIAGNOSIS — E785 Hyperlipidemia, unspecified: Secondary | ICD-10-CM | POA: Diagnosis not present

## 2021-05-24 DIAGNOSIS — Z6834 Body mass index (BMI) 34.0-34.9, adult: Secondary | ICD-10-CM

## 2021-05-24 DIAGNOSIS — K429 Umbilical hernia without obstruction or gangrene: Secondary | ICD-10-CM | POA: Diagnosis not present

## 2021-05-24 DIAGNOSIS — I10 Essential (primary) hypertension: Secondary | ICD-10-CM

## 2021-05-24 DIAGNOSIS — R110 Nausea: Secondary | ICD-10-CM

## 2021-05-24 DIAGNOSIS — R160 Hepatomegaly, not elsewhere classified: Secondary | ICD-10-CM

## 2021-05-24 DIAGNOSIS — K769 Liver disease, unspecified: Secondary | ICD-10-CM

## 2021-05-24 DIAGNOSIS — Z131 Encounter for screening for diabetes mellitus: Secondary | ICD-10-CM | POA: Diagnosis not present

## 2021-05-24 DIAGNOSIS — Z1211 Encounter for screening for malignant neoplasm of colon: Secondary | ICD-10-CM

## 2021-05-24 NOTE — Patient Instructions (Signed)
A few things to remember from today's visit:   Nausea without vomiting - Plan: CT LIVER ABD W/WO  Umbilical hernia without obstruction and without gangrene - Plan: Ambulatory referral to General Surgery  Screening for colon cancer - Plan: Ambulatory referral to Gastroenterology  Hypertension, essential - Plan: Comprehensive metabolic panel  Class 1 obesity with serious comorbidity and body mass index (BMI) of 34.0 to 34.9 in adult, unspecified obesity type  Liver lesion - Plan: Comprehensive metabolic panel  Diabetes mellitus screening - Plan: Hemoglobin A1c  If you need refills please call your pharmacy. Do not use My Chart to request refills or for acute issues that need immediate attention.   Nausea can be related to GERD. Avoid skipping breakfast. Liver CT order placed as requested. Appt with surgeon will be arranged.  Start counting calories, 1600 Kcal per day and engage in regular ,low impact physical activity. Umbilical Hernia, Adult  A hernia is a bulge of tissue that pushes through an opening between muscles. An umbilical hernia happens in the abdomen, near the belly button (umbilicus). The hernia may contain tissues from the small intestine, large intestine, or fatty tissue covering the intestines (omentum). Umbilical hernias in adults tend to get worse over time, and they requiresurgical treatment. There are several types of umbilical hernias. You may have: A hernia located just above or below the umbilicus (indirect hernia). This is the most common type of umbilical hernia in adults. A hernia that forms through an opening formed by the umbilicus (direct hernia). A hernia that comes and goes (reducible hernia). A reducible hernia may be visible only when you strain, lift something heavy, or cough. This type of hernia can be pushed back into the abdomen (reduced). A hernia that traps abdominal tissue inside the hernia (incarcerated hernia). This type of hernia cannot be  reduced. A hernia that cuts off blood flow to the tissues inside the hernia (strangulated hernia). The tissues can start to die if this happens. This type of hernia requires emergency treatment. What are the causes? An umbilical hernia happens when tissue inside the abdomen presses on a weakarea of the abdominal muscles. What increases the risk? You may have a greater risk of this condition if you: Are obese. Have had several pregnancies. Have a buildup of fluid inside your abdomen (ascites). Have had surgery that weakens the abdominal muscles. What are the signs or symptoms? The main symptom of this condition is a painless bulge at or near the belly button. A reducible hernia may be visible only when you strain, lift something heavy, or cough. Other symptoms may include: Dull pain. A feeling of pressure. Symptoms of a strangulated hernia may include: Pain that gets increasingly worse. Nausea and vomiting. Pain when pressing on the hernia. Skin over the hernia becoming red or purple. Constipation. Blood in the stool. How is this diagnosed? This condition may be diagnosed based on: A physical exam. You may be asked to cough or strain while standing. These actions increase the pressure inside your abdomen and force the hernia through the opening in your muscles. Your health care provider may try to reduce the hernia by pressing on it. Your symptoms and medical history. How is this treated? Surgery is the only treatment for an umbilical hernia. Surgery for a strangulated hernia is done as soon as possible. If you have a small herniathat is not incarcerated, you may need to lose weight before having surgery. Follow these instructions at home: Lose weight, if told by your  health care provider. Do not try to push the hernia back in. Watch your hernia for any changes in color or size. Tell your health care provider if any changes occur. You may need to avoid activities that increase pressure  on your hernia. Do not lift anything that is heavier than 10 lb (4.5 kg) until your health care provider says that this is safe. Take over-the-counter and prescription medicines only as told by your health care provider. Keep all follow-up visits as told by your health care provider. This is important. Contact a health care provider if: Your hernia gets larger. Your hernia becomes painful. Get help right away if: You develop sudden, severe pain near the area of your hernia. You have pain as well as nausea or vomiting. You have pain and the skin over your hernia changes color. You develop a fever. This information is not intended to replace advice given to you by your health care provider. Make sure you discuss any questions you have with your healthcare provider. Document Revised: 12/05/2017 Document Reviewed: 04/23/2017 Elsevier Patient Education  2021 [employer]  Please be sure medication list is accurate. If a new problem present, please set up appointment sooner than planned today.

## 2021-05-24 NOTE — Progress Notes (Signed)
Chief Complaint  Patient presents with   Bloated   HPI: [name] is a 60 y.o. female with hx of anxiety,HTN,OSA,and somatization disorder here today with a few concerns. C/O bloating sensation, denies having abdominal pain. Exacerbated by eating any type of food. This is a chronic problem.  Nausea in the morning.She feels like she needs "to eat something sweet." Sometimes she eats fruit but also candy bars and chocolate. She wonders if she has diabetes. Eating helps with the nausea. Occasional heartburn.  Bowel movements 2 times daily. She has not seen blood in stool or melena.  "Swelling" of umbilicus. She thought it was fat. It is not causing pain, unchanged for a while. She wants to have colonoscopy scheduled. Colonoscopy in 10/2011.  She is also concerned about wt gain. Exercising some, she has not been consistent. She tries "not to eat a lot." Eats sometimes once daily.  She is also concerned about liver [place] several test done in [place] and reported that imaging and labs were negative except for abdominal imaging.  According to pt,it was recommended to have "liver CT" every 6 months to monitor for changes.   She showed me imaging report on her phone on 03/23/20: 51 mm x 41 mm and 45 mm x25 mm hepatic lesions, hemangiomas. Tiny cyst also reported. Liver CT order was placed in 03/2020 but she has not received information about appt. She is very concerned about possible liver disease and demanding to have liver CT done and [place]. Negative for color changes of stool or urine, RUQ pain,or jaundice.  Lab Results  Component Value Date   HGBA1C 5.5 09/10/2019   Lab Results  Component Value Date   ALT 19 03/23/2020   AST 21 03/23/2020   ALKPHOS 70 03/23/2020   BILITOT 0.4 03/23/2020   HTN: She is on Amlodipine 2.5 mg daily. Negative for severe/frequent headache, visual changes, exertional chest pain, dyspnea, palpitation, focal  weakness, or edema.  Lab Results  Component Value Date   CREATININE 0.63 03/23/2020   BUN 10 03/23/2020   NA 135 03/23/2020   K 3.5 03/23/2020   CL 97 03/23/2020   CO2 30 03/23/2020   She also would like to have labs done today, lipid panel. HLD: She is not on pharmacologic treatment. Lab Results  Component Value Date   CHOL 204 (H) 09/10/2019   HDL 55.50 09/10/2019   LDLCALC 130 (H) 09/10/2019   LDLDIRECT 132.4 08/21/2008   LDLDIRECT 132.4 08/21/2008   TRIG 95.0 09/10/2019   CHOLHDL 4 09/10/2019   Review of Systems  Constitutional:  Positive for fatigue. Negative for activity change, appetite change and fever.  HENT:  Negative for mouth sores, nosebleeds and trouble swallowing.   Respiratory:  Negative for cough and wheezing.   Gastrointestinal:  Negative for vomiting.       Negative for changes in bowel habits.  Genitourinary:  Negative for decreased urine volume, dysuria and hematuria.  Skin:  Negative for pallor and rash.  Neurological:  Negative for syncope, facial asymmetry and weakness.  Psychiatric/Behavioral:  Negative for confusion. The patient is nervous/anxious.   Rest see pertinent positives and negatives per HPI.  Current Outpatient Medications on File Prior to Visit  Medication Sig Dispense Refill   amLODipine (NORVASC) 2.5 MG tablet Take 1 tablet (2.5 mg total) by mouth daily. 90 tablet 2   No current facility-administered medications on file prior to visit.   Past Medical History:  Diagnosis Date  Anal fissure    hx of    Anxiety    GERD (gastroesophageal reflux disease)    H. pylori infection    Hx: UTI (urinary tract infection)    Hyperlipidemia    Hypertension    Irritable bowel syndrome (IBS)    Allergies  Allergen Reactions   Penicillins Rash    Has patient had a PCN reaction causing immediate rash, facial/tongue/throat swelling, SOB or lightheadedness with hypotension: No Has patient had a PCN reaction causing severe rash involving  mucus membranes or skin necrosis: No Has patient had a PCN reaction that required hospitalization No Has patient had a PCN reaction occurring within the last 10 years: No If all of the above answers are "NO", then may proceed with Cephalosporin use.     Social History   Socioeconomic History   Marital status: Married    Spouse name: Not on file   Number of children: 3   Years of education: Not on file   Highest education level: Not on file  Occupational History   Not on file  Tobacco Use   Smoking status: Never   Smokeless tobacco: Never  Substance and Sexual Activity   Alcohol use: No   Drug use: No   Sexual activity: Yes  Other Topics Concern   Not on file  Social History Narrative   Caffeine daily    Social Determinants of Health   Financial Resource Strain: Not on file  Food Insecurity: Not on file  Transportation Needs: Not on file  Physical Activity: Not on file  Stress: Not on file  Social Connections: Not on file    Vitals:   05/24/21 1455  BP: 128/80  Pulse: 93  Resp: 16  Temp: 97.7 F (36.5 C)  SpO2: 98%   Body mass index is 34.02 kg/m.  Physical Exam Vitals and nursing note reviewed.  Constitutional:      General: She is not in acute distress.    Appearance: She is well-developed.  HENT:     Head: Normocephalic and atraumatic.     Mouth/Throat:     Mouth: Mucous membranes are moist.     Pharynx: Oropharynx is clear.  Eyes:     Conjunctiva/sclera: Conjunctivae normal.  Cardiovascular:     Rate and Rhythm: Normal rate and regular rhythm.     Pulses:          Dorsalis pedis pulses are 2+ on the right side and 2+ on the left side.     Heart sounds: No murmur heard. Pulmonary:     Effort: Pulmonary effort is normal. No respiratory distress.     Breath sounds: Normal breath sounds.  Abdominal:     Palpations: Abdomen is soft. There is no hepatomegaly or mass.     Tenderness: There is no abdominal tenderness.     Hernia: A hernia (Mildly  tender when trying to reduce it.) is present. Hernia is present in the umbilical area.    Lymphadenopathy:     Cervical: No cervical adenopathy.  Skin:    General: Skin is warm.     Findings: No erythema or rash.  Neurological:     General: No focal deficit present.     Mental Status: She is alert and oriented to person, place, and time.     Cranial Nerves: No cranial nerve deficit.     Gait: Gait normal.  Psychiatric:        Mood and Affect: Mood is anxious.  Comments: Well groomed, good eye contact.    ASSESSMENT AND PLAN:  [name] was seen today for bloated.  Diagnoses and all orders for this visit: Orders Placed This Encounter  Procedures   CT LIVER ABD W/WO   Comprehensive metabolic panel   Hemoglobin A1c   Lipid panel   Ambulatory referral to Gastroenterology   Ambulatory referral to General Surgery   Lab Results  Component Value Date   ALT 15 05/24/2021   AST 14 05/24/2021   ALKPHOS 70 05/24/2021   BILITOT 0.4 05/24/2021   Lab Results  Component Value Date   CHOL 213 (H) 05/24/2021   HDL 60.10 05/24/2021   LDLCALC 136 (H) 05/24/2021   LDLDIRECT 132.4 08/21/2008   LDLDIRECT 132.4 08/21/2008   TRIG 88.0 05/24/2021   CHOLHDL 4 05/24/2021   Lab Results  Component Value Date   HGBA1C 5.8 05/24/2021   Lab Results  Component Value Date   CREATININE 0.57 05/24/2021   BUN 16 05/24/2021   NA 139 05/24/2021   K 3.9 05/24/2021   CL 102 05/24/2021   CO2 28 05/24/2021   Nausea without vomiting It seems to be alleviated by eating, ? Dyspepsia/GERD. Recommend trying to eat breakfast daily, small portions, GERD precautions. She prefers not to add medications for now but we can add a PPI is non pharmacologic treatment doe snot help. I do not think we need RUQ [place] at this time but will be considered if nausea is persistent.  Umbilical hernia without obstruction and without gangrene Mildly tender when trying to reduce. Educated about Dx,possible  complications,and treatment. Surgery referral placed. Instructed about warning signs.  Hypertension, essential BP adequately controlled. Continue current management: Amlodipine 2.5 mg daily. DASH/low salt diet. Monitor BP at home. Eye exam recommended annually.  Class 1 obesity with serious comorbidity and body mass index (BMI) of 34.0 to 34.9 in adult, unspecified obesity type We discussed benefits of wt loss as well as adverse effects of obesity. Consistency with healthy diet and physical activity recommended. Weight Watchers is a good option as well as daily brisk walking for 15-30 min as tolerated.  Liver mass She is going to try to get copy of report. Most likely benign lesions, hemangioma. Liver CT order placed.  Hyperlipidemia, unspecified hyperlipidemia type Non pharmacologic treatment recommended for now. Further recommendations will be given according to 10 years CVD risk score and lipid panel numbers. The 10-year ASCVD risk score Denman George DC Montez Hageman., et al., 2013) is: 3.2%   Values used to calculate the score:     Age: 43 years     Sex: Female     Is Non-Hispanic African American: No     Diabetic: No     Tobacco smoker: No     Systolic Blood Pressure: 128 mmHg     Is BP treated: Yes     HDL Cholesterol: 60.1 mg/dL     Total Cholesterol: 213 mg/dL  Screening for colon cancer -     Ambulatory referral to Gastroenterology  Diabetes mellitus screening -     Hemoglobin A1c  I spent a total of 45 minutes in both face to face and non face to face activities for this visit on the date of this encounter. During this time history was obtained and documented, examination was performed, prior labs/imaging reviewed, and assessment/plan discussed.  Return in about 2 months (around 07/25/2021) for Nausea..   Jashley Yellin G. [place], MD  [facility]. [place] [facility].

## 2021-05-25 LAB — COMPREHENSIVE METABOLIC PANEL
ALT: 15 U/L (ref 0–35)
AST: 14 U/L (ref 0–37)
Albumin: 4.1 g/dL (ref 3.5–5.2)
Alkaline Phosphatase: 70 U/L (ref 39–117)
BUN: 16 mg/dL (ref 6–23)
CO2: 28 mEq/L (ref 19–32)
Calcium: 9.6 mg/dL (ref 8.4–10.5)
Chloride: 102 mEq/L (ref 96–112)
Creatinine, Ser: 0.57 mg/dL (ref 0.40–1.20)
GFR: 100.59 mL/min (ref >60.00)
Glucose, Bld: 94 mg/dL (ref 70–99)
Potassium: 3.9 mEq/L (ref 3.5–5.1)
Sodium: 139 mEq/L (ref 135–145)
Total Bilirubin: 0.4 mg/dL (ref 0.2–1.2)
Total Protein: 6.8 g/dL (ref 6.0–8.3)

## 2021-05-25 LAB — HEMOGLOBIN A1C: Hgb A1c MFr Bld: 5.8 % (ref 4.6–6.5)

## 2021-05-25 LAB — LIPID PANEL
Cholesterol: 213 mg/dL — ABNORMAL HIGH (ref 0–200)
HDL: 60.1 mg/dL (ref >39.00)
LDL Cholesterol: 136 mg/dL — ABNORMAL HIGH (ref 0–99)
NonHDL: 153.32
Total CHOL/HDL Ratio: 4
Triglycerides: 88 mg/dL (ref 0.0–149.0)
VLDL: 17.6 mg/dL (ref 0.0–40.0)

## 2021-06-01 ENCOUNTER — Other Ambulatory Visit: Payer: Self-pay | Admitting: Family Medicine

## 2021-06-01 DIAGNOSIS — I10 Essential (primary) hypertension: Secondary | ICD-10-CM

## 2021-06-10 ENCOUNTER — Telehealth: Payer: Self-pay

## 2021-06-10 NOTE — Telephone Encounter (Signed)
I called and spoke with patient. She is aware that there isn't a way we can have the insurance pay for the medication.

## 2021-06-10 NOTE — Telephone Encounter (Signed)
Patient called stated lost her medication and insurance wont cover the replacement and requested help from PCP.

## 2021-08-01 ENCOUNTER — Telehealth (INDEPENDENT_AMBULATORY_CARE_PROVIDER_SITE_OTHER): Payer: Self-pay | Admitting: Family Medicine

## 2021-08-01 ENCOUNTER — Encounter: Payer: Self-pay | Admitting: Family Medicine

## 2021-08-01 VITALS — Ht 62.5 in

## 2021-08-01 DIAGNOSIS — Z5329 Procedure and treatment not carried out because of patient's decision for other reasons: Secondary | ICD-10-CM

## 2021-08-15 NOTE — Progress Notes (Signed)
No show

## 2021-09-27 LAB — HM PAP SMEAR: HM Pap smear: NORMAL

## 2021-09-27 LAB — HM MAMMOGRAPHY

## 2021-09-28 ENCOUNTER — Other Ambulatory Visit: Payer: Self-pay | Admitting: Obstetrics & Gynecology

## 2021-09-28 DIAGNOSIS — R928 Other abnormal and inconclusive findings on diagnostic imaging of breast: Secondary | ICD-10-CM

## 2021-10-17 ENCOUNTER — Encounter: Payer: Self-pay | Admitting: Gastroenterology

## 2021-10-17 ENCOUNTER — Ambulatory Visit
Admission: RE | Admit: 2021-10-17 | Discharge: 2021-10-17 | Disposition: A | Discharge status: Home / Self Care | Payer: Managed Care, Other (non HMO) | Source: Ambulatory Visit | Attending: Obstetrics & Gynecology | Admitting: Obstetrics & Gynecology

## 2021-10-17 ENCOUNTER — Ambulatory Visit: Payer: Managed Care, Other (non HMO)

## 2021-10-17 DIAGNOSIS — R928 Other abnormal and inconclusive findings on diagnostic imaging of breast: Secondary | ICD-10-CM

## 2021-10-25 ENCOUNTER — Other Ambulatory Visit: Payer: Managed Care, Other (non HMO)

## 2021-11-18 ENCOUNTER — Encounter: Payer: Self-pay | Admitting: Family Medicine

## 2022-01-02 ENCOUNTER — Other Ambulatory Visit: Payer: Self-pay | Admitting: Family Medicine

## 2022-01-02 DIAGNOSIS — I10 Essential (primary) hypertension: Secondary | ICD-10-CM

## 2022-02-23 ENCOUNTER — Other Ambulatory Visit: Payer: Self-pay | Admitting: Family Medicine

## 2022-02-23 DIAGNOSIS — I10 Essential (primary) hypertension: Secondary | ICD-10-CM

## 2022-04-19 ENCOUNTER — Other Ambulatory Visit: Payer: Self-pay | Admitting: Family Medicine

## 2022-04-19 DIAGNOSIS — I10 Essential (primary) hypertension: Secondary | ICD-10-CM

## 2022-06-17 ENCOUNTER — Other Ambulatory Visit: Payer: Self-pay | Admitting: Family Medicine

## 2022-06-17 DIAGNOSIS — I10 Essential (primary) hypertension: Secondary | ICD-10-CM

## 2022-06-21 ENCOUNTER — Telehealth: Payer: Self-pay | Admitting: Family Medicine

## 2022-06-21 DIAGNOSIS — I10 Essential (primary) hypertension: Secondary | ICD-10-CM

## 2022-06-21 MED ORDER — AMLODIPINE BESYLATE 2.5 MG PO TABS: 2.5000 mg | ORAL_TABLET | Freq: Two times a day (BID) | ORAL | 0 refills | Status: DC

## 2022-06-21 NOTE — Telephone Encounter (Signed)
Pt is calling and has sch cpe for 07-11-2022 and would like a refill on amLODipine (NORVASC) 2.5 MG tablet . Pt said she take one pill twice a day  [id number] - ,  - [address] AT [facility] [address] Phone:  [phone]  Fax:  [phone]

## 2022-07-11 ENCOUNTER — Encounter: Payer: Managed Care, Other (non HMO) | Admitting: Family Medicine

## 2022-07-13 ENCOUNTER — Other Ambulatory Visit: Payer: Self-pay | Admitting: Family Medicine

## 2022-07-13 DIAGNOSIS — I10 Essential (primary) hypertension: Secondary | ICD-10-CM

## 2022-07-14 ENCOUNTER — Other Ambulatory Visit: Payer: Self-pay

## 2022-07-14 MED ORDER — AMLODIPINE BESYLATE 5 MG PO TABS: 5.0000 mg | ORAL_TABLET | Freq: Every day | ORAL | 0 refills | Status: DC

## 2022-07-25 ENCOUNTER — Encounter: Payer: Managed Care, Other (non HMO) | Admitting: Family Medicine

## 2022-07-25 NOTE — Progress Notes (Signed)
HPI: [name] is a 61 y.o. female, who is here today with her daughter for her routine physical.  Last CPE: 2020  She is walking her dog daily for 30 min but no briskly. She is trying to follow a healthful diet, not consistently but for the past 2 weeks she has been cooking, increased vegetable intake, no read meat. She eats mainly fish.  Chronic medical problems: HTN,GAD,fatigue,seasonal allergies,and HLD among some.  Immunization History  Administered Date(s) Administered   Td 08/21/2008   Tdap 09/10/2019   Zoster Recombinat (Shingrix) 07/26/2022   Health Maintenance  Topic Date Due   PAP SMEAR-Modifier  07/28/2019   MAMMOGRAM  09/09/2021   COLONOSCOPY (Pts 45-72yrs Insurance coverage will need to be confirmed)  10/15/2021   COVID-19 Vaccine (1) 08/11/2022 (Originally 01/27/1962)   Zoster Vaccines- Shingrix (2 of 2) 09/20/2022   TETANUS/TDAP  09/09/2029   Hepatitis C Screening  Completed   HIV Screening  Completed   HPV VACCINES  Aged Out   INFLUENZA VACCINE  Discontinued  She is established with gyn, she retired.  HTN: She is on Amlodipine 5 mg, taking 1/2 tab bid. She would like rx to be changed to 2.5 mg tab to continue bid.  HLD on non pharmacologic treatment. Component     Latest Ref Rng 05/24/2021  Cholesterol     0 - 200 mg/dL 759 (H)   Triglycerides     0.0 - 149.0 mg/dL 16.3   HDL Cholesterol     >39.00 mg/dL 84.66   VLDL     0.0 - 40.0 mg/dL 59.9   LDL (calc)     0 - 99 mg/dL 357 (H)   Total CHOL/HDL Ratio 4   NonHDL 153.32     She is reporting that since her last visit she has been in her country and had thyroid US,underwent thyroid Bx and it was "fine."  Review of Systems  Constitutional:  Positive for fatigue. Negative for appetite change and fever.  HENT:  Positive for postnasal drip and rhinorrhea. Negative for dental problem, hearing loss, mouth sores, sore throat and trouble swallowing.   Eyes:  Negative for redness and visual  disturbance.  Respiratory:  Negative for cough, shortness of breath and wheezing.   Cardiovascular:  Negative for chest pain and leg swelling.  Gastrointestinal:  Negative for abdominal pain, nausea and vomiting.       No changes in bowel habits.  Endocrine: Negative for cold intolerance, heat intolerance, polydipsia, polyphagia and polyuria.  Genitourinary:  Negative for decreased urine volume, dysuria, hematuria, vaginal bleeding and vaginal discharge.  Musculoskeletal:  Positive for arthralgias. Negative for gait problem and myalgias.  Skin:  Negative for color change and rash.  Allergic/Immunologic: Positive for environmental allergies.  Neurological:  Negative for syncope, weakness and headaches.  Hematological:  Negative for adenopathy. Does not bruise/bleed easily.  Psychiatric/Behavioral:  Positive for sleep disturbance. Negative for confusion. The patient is nervous/anxious.   All other systems reviewed and are negative.  No current outpatient medications on file prior to visit.   No current facility-administered medications on file prior to visit.   Past Medical History:  Diagnosis Date   Anal fissure    hx of    Anxiety    GERD (gastroesophageal reflux disease)    H. pylori infection    Hx: UTI (urinary tract infection)    Hyperlipidemia    Hypertension    Irritable bowel syndrome (IBS)    Past Surgical History:  Procedure  Laterality Date   ABDOMINAL HYSTERECTOMY  10/2016   Allergies  Allergen Reactions   Penicillins Rash    Has patient had a PCN reaction causing immediate rash, facial/tongue/throat swelling, SOB or lightheadedness with hypotension: No Has patient had a PCN reaction causing severe rash involving mucus membranes or skin necrosis: No Has patient had a PCN reaction that required hospitalization No Has patient had a PCN reaction occurring within the last 10 years: No If all of the above answers are "NO", then may proceed with Cephalosporin use.     Family History  Problem Relation Age of Onset   Diabetes Mother    Heart disease Mother    Stroke Mother    Hypertension Mother    Heart disease Father    Colon cancer Neg Hx    Social History   Socioeconomic History   Marital status: Married    Spouse name: Not on file   Number of children: 3   Years of education: Not on file   Highest education level: Not on file  Occupational History   Not on file  Tobacco Use   Smoking status: Never   Smokeless tobacco: Never  Substance and Sexual Activity   Alcohol use: No   Drug use: No   Sexual activity: Yes  Other Topics Concern   Not on file  Social History Narrative   Caffeine daily    Social Determinants of Health   Financial Resource Strain: Not on file  Food Insecurity: Not on file  Transportation Needs: Not on file  Physical Activity: Not on file  Stress: Not on file  Social Connections: Not on file   Vitals:   07/26/22 1353  BP: 134/80  Pulse: 74  Resp: 12  SpO2: 98%  Body mass index is 33.57 kg/m. Wt Readings from Last 3 Encounters:  07/26/22 186 lb 8 oz (84.6 kg)  05/24/21 189 lb (85.7 kg)  07/13/20 177 lb (80.3 kg)  Physical Exam Vitals and nursing note reviewed.  Constitutional:      General: She is not in acute distress.    Appearance: She is well-developed and well-groomed.  HENT:     Head: Normocephalic and atraumatic.     Right Ear: Hearing, tympanic membrane, ear canal and external ear normal.     Left Ear: Hearing, tympanic membrane, ear canal and external ear normal.     Mouth/Throat:     Mouth: Mucous membranes are moist.     Pharynx: Oropharynx is clear. Uvula midline.  Eyes:     Extraocular Movements: Extraocular movements intact.     Conjunctiva/sclera: Conjunctivae normal.     Pupils: Pupils are equal, round, and reactive to light.  Neck:     Thyroid: No thyromegaly.     Trachea: No tracheal deviation.  Cardiovascular:     Rate and Rhythm: Normal rate and regular rhythm.      Pulses:          Dorsalis pedis pulses are 2+ on the right side and 2+ on the left side.     Heart sounds: No murmur heard. Pulmonary:     Effort: Pulmonary effort is normal. No respiratory distress.     Breath sounds: Normal breath sounds.  Abdominal:     Palpations: Abdomen is soft. There is no hepatomegaly or mass.     Tenderness: There is no abdominal tenderness.  Genitourinary:    Comments: Deferred to gyn. Musculoskeletal:     Right lower leg: No edema.  Left lower leg: No edema.     Comments: No signs of synovitis appreciated.  Lymphadenopathy:     Cervical: No cervical adenopathy.     Upper Body:     Right upper body: No supraclavicular adenopathy.     Left upper body: No supraclavicular adenopathy.  Skin:    General: Skin is warm.     Findings: No erythema or rash.  Neurological:     General: No focal deficit present.     Mental Status: She is alert and oriented to person, place, and time.     Cranial Nerves: No cranial nerve deficit.     Coordination: Coordination normal.     Gait: Gait normal.     Deep Tendon Reflexes:     Reflex Scores:      Bicep reflexes are 2+ on the right side and 2+ on the left side.      Patellar reflexes are 2+ on the right side and 2+ on the left side. Psychiatric:        Mood and Affect: Affect normal. Mood is anxious.   ASSESSMENT AND PLAN:  [name] was here today annual physical examination.  Orders Placed This Encounter  Procedures   Varicella-zoster vaccine IM   Comprehensive metabolic panel   CBC   TSH   Lipid panel   Hemoglobin A1c   Ambulatory referral to Gastroenterology   Lab Results  Component Value Date   HGBA1C 6.0 07/26/2022   Lab Results  Component Value Date   TSH 1.45 07/26/2022   Lab Results  Component Value Date   WBC 6.4 07/26/2022   HGB 13.1 07/26/2022   HCT 38.7 07/26/2022   MCV 89.8 07/26/2022   PLT 283.0 07/26/2022   Lab Results  Component Value Date   CHOL 177 07/26/2022    HDL 55.00 07/26/2022   LDLCALC 105 (H) 07/26/2022   LDLDIRECT 132.4 08/21/2008   LDLDIRECT 132.4 08/21/2008   TRIG 85.0 07/26/2022   CHOLHDL 3 07/26/2022   Lab Results  Component Value Date   ALT 22 07/26/2022   AST 19 07/26/2022   ALKPHOS 71 07/26/2022   BILITOT 0.7 07/26/2022   Routine general medical examination at a health care facility We discussed the importance of regular physical activity and healthy diet for prevention of chronic illness and/or complications. Preventive guidelines reviewed. Vaccination updated. Ca++ and vit D supplementation recommended. Next CPE in a year. The 10-year ASCVD risk score (Arnett DK, et al., 2019) is: 4%   Values used to calculate the score:     Age: 36 years     Sex: Female     Is Non-Hispanic African American: No     Diabetic: No     Tobacco smoker: No     Systolic Blood Pressure: 818 mmHg     Is BP treated: Yes     HDL Cholesterol: 55 mg/dL     Total Cholesterol: 177 mg/dL  Colon cancer screening -     Ambulatory referral to Gastroenterology  Screening for endocrine, metabolic and immunity disorder -     Hemoglobin A1c -     Comprehensive metabolic panel  Need for shingles vaccine -     Varicella-zoster vaccine IM  Hypertension, essential BP adequately controlled. She would like to continue Amlodipine 2.5 mg bid, so prescription changed from 5 mg. DASH/low salt diet to continue. Monitor BP at home. Eye exam annually recommended.  Hyperlipidemia Non pharmacologic treatment recommended for now. Further recommendations will be given  according to 10 years CVD risk score and lipid panel numbers.  She was instructed to have copy of thyroid US and Bx faxed to [place].  Return in 1 year (on 07/27/2023) for CPE and f/u.  Deborah Held G. [place], MD  [facility]. [place] office.

## 2022-07-26 ENCOUNTER — Encounter: Payer: Self-pay | Admitting: Family Medicine

## 2022-07-26 ENCOUNTER — Ambulatory Visit (INDEPENDENT_AMBULATORY_CARE_PROVIDER_SITE_OTHER): Payer: Managed Care, Other (non HMO) | Admitting: Family Medicine

## 2022-07-26 VITALS — BP 134/80 | HR 74 | Resp 12 | Ht 62.5 in | Wt 186.5 lb

## 2022-07-26 DIAGNOSIS — Z23 Encounter for immunization: Secondary | ICD-10-CM

## 2022-07-26 DIAGNOSIS — Z1329 Encounter for screening for other suspected endocrine disorder: Secondary | ICD-10-CM | POA: Diagnosis not present

## 2022-07-26 DIAGNOSIS — Z1211 Encounter for screening for malignant neoplasm of colon: Secondary | ICD-10-CM | POA: Diagnosis not present

## 2022-07-26 DIAGNOSIS — Z130 Encounter for screening for diseases of the blood and blood-forming organs and certain disorders involving the immune mechanism: Secondary | ICD-10-CM | POA: Diagnosis not present

## 2022-07-26 DIAGNOSIS — Z13228 Encounter for screening for other metabolic disorders: Secondary | ICD-10-CM

## 2022-07-26 DIAGNOSIS — I10 Essential (primary) hypertension: Secondary | ICD-10-CM

## 2022-07-26 DIAGNOSIS — Z0000 Encounter for general adult medical examination without abnormal findings: Secondary | ICD-10-CM

## 2022-07-26 DIAGNOSIS — E785 Hyperlipidemia, unspecified: Secondary | ICD-10-CM

## 2022-07-26 LAB — LIPID PANEL
Cholesterol: 177 mg/dL (ref 0–200)
HDL: 55 mg/dL (ref >39.00)
LDL Cholesterol: 105 mg/dL — ABNORMAL HIGH (ref 0–99)
NonHDL: 122.49
Total CHOL/HDL Ratio: 3
Triglycerides: 85 mg/dL (ref 0.0–149.0)
VLDL: 17 mg/dL (ref 0.0–40.0)

## 2022-07-26 LAB — COMPREHENSIVE METABOLIC PANEL
ALT: 22 U/L (ref 0–35)
AST: 19 U/L (ref 0–37)
Albumin: 3.8 g/dL (ref 3.5–5.2)
Alkaline Phosphatase: 71 U/L (ref 39–117)
BUN: 15 mg/dL (ref 6–23)
CO2: 30 mEq/L (ref 19–32)
Calcium: 9.5 mg/dL (ref 8.4–10.5)
Chloride: 100 mEq/L (ref 96–112)
Creatinine, Ser: 0.68 mg/dL (ref 0.40–1.20)
GFR: 95.62 mL/min (ref >60.00)
Glucose, Bld: 79 mg/dL (ref 70–99)
Potassium: 3.4 mEq/L — ABNORMAL LOW (ref 3.5–5.1)
Sodium: 138 mEq/L (ref 135–145)
Total Bilirubin: 0.7 mg/dL (ref 0.2–1.2)
Total Protein: 7.2 g/dL (ref 6.0–8.3)

## 2022-07-26 LAB — CBC
HCT: 38.7 % (ref 36.0–46.0)
Hemoglobin: 13.1 g/dL (ref 12.0–15.0)
MCHC: 33.8 g/dL (ref 30.0–36.0)
MCV: 89.8 fl (ref 78.0–100.0)
Platelets: 283 10*3/uL (ref 150.0–400.0)
RBC: 4.31 Mil/uL (ref 3.87–5.11)
RDW: 13.6 % (ref 11.5–15.5)
WBC: 6.4 10*3/uL (ref 4.0–10.5)

## 2022-07-26 LAB — HEMOGLOBIN A1C: Hgb A1c MFr Bld: 6 % (ref 4.6–6.5)

## 2022-07-26 LAB — TSH: TSH: 1.45 u[IU]/mL (ref 0.35–5.50)

## 2022-07-26 MED ORDER — AMLODIPINE BESYLATE 2.5 MG PO TABS: 2.5000 mg | ORAL_TABLET | Freq: Two times a day (BID) | ORAL | 2 refills | Status: DC

## 2022-07-26 MED ORDER — AMLODIPINE BESYLATE 2.5 MG PO TABS: 2.5000 mg | ORAL_TABLET | Freq: Two times a day (BID) | ORAL | 3 refills | Status: DC

## 2022-07-26 NOTE — Assessment & Plan Note (Signed)
BP adequately controlled. She would like to continue Amlodipine 2.5 mg bid, so prescription changed from 5 mg. DASH/low salt diet to continue. Monitor BP at home. Eye exam annually recommended.

## 2022-07-26 NOTE — Patient Instructions (Addendum)
A few things to remember from today's visit:  Routine general medical examination at a health care facility  Hypertension, essential - Plan: amLODipine (NORVASC) 2.5 MG tablet, Comprehensive metabolic panel, CBC, TSH  Hyperlipidemia, unspecified hyperlipidemia type - Plan: Comprehensive metabolic panel, Lipid panel  Colon cancer screening - Plan: Ambulatory referral to Gastroenterology  Screening for endocrine, metabolic and immunity disorder - Plan: Comprehensive metabolic panel, Hemoglobin A1c  If you need refills for medications you take chronically, please call your pharmacy. Do not use My Chart to request refills or for acute issues that need immediate attention. If you send a my chart message, it may take a few days to be addressed, specially if I am not in the office.  Please be sure medication list is accurate. If a new problem present, please set up appointment sooner than planned today.  Monitor blood pressure at home. Melatonin over the counter 5-15 mg 2 hours before bedtime and with empty stomach for sleep. Increase physical activity as tolerated, 15 min daily. Please arrange appt with your gynecologist.  Health Maintenance, Female Adopting a healthy lifestyle and getting preventive care are important in promoting health and wellness. Ask your health care provider about: The right schedule for you to have regular tests and exams. Things you can do on your own to prevent diseases and keep yourself healthy. What should I know about diet, weight, and exercise? Eat a healthy diet  Eat a diet that includes plenty of vegetables, fruits, low-fat dairy products, and lean protein. Do not eat a lot of foods that are high in solid fats, added sugars, or sodium. Maintain a healthy weight Body mass index (BMI) is used to identify weight problems. It estimates body fat based on height and weight. Your health care provider can help determine your BMI and help you achieve or maintain a  healthy weight. Get regular exercise Get regular exercise. This is one of the most important things you can do for your health. Most adults should: Exercise for at least 150 minutes each week. The exercise should increase your heart rate and make you sweat (moderate-intensity exercise). Do strengthening exercises at least twice a week. This is in addition to the moderate-intensity exercise. Spend less time sitting. Even light physical activity can be beneficial. Watch cholesterol and blood lipids Have your blood tested for lipids and cholesterol at 59 years of age, then have this test every 5 years. Have your cholesterol levels checked more often if: Your lipid or cholesterol levels are high. You are older than 59 years of age. You are at high risk for heart disease. What should I know about cancer screening? Depending on your health history and family history, you may need to have cancer screening at various ages. This may include screening for: Breast cancer. Cervical cancer. Colorectal cancer. Skin cancer. Lung cancer. What should I know about heart disease, diabetes, and high blood pressure? Blood pressure and heart disease High blood pressure causes heart disease and increases the risk of stroke. This is more likely to develop in people who have high blood pressure readings or are overweight. Have your blood pressure checked: Every 3-5 years if you are 31-45 years of age. Every year if you are 53 years old or older. Diabetes Have regular diabetes screenings. This checks your fasting blood sugar level. Have the screening done: Once every three years after age 66 if you are at a normal weight and have a low risk for diabetes. More often and at a younger  age if you are overweight or have a high risk for diabetes. What should I know about preventing infection? Hepatitis B If you have a higher risk for hepatitis B, you should be screened for this virus. Talk with your health care  provider to find out if you are at risk for hepatitis B infection. Hepatitis C Testing is recommended for: Everyone born from [birth year] through 1963. Anyone with known risk factors for hepatitis C. Sexually transmitted infections (STIs) Get screened for STIs, including gonorrhea and chlamydia, if: You are sexually active and are younger than 59 years of age. You are older than 59 years of age and your health care provider tells you that you are at risk for this type of infection. Your sexual activity has changed since you were last screened, and you are at increased risk for chlamydia or gonorrhea. Ask your health care provider if you are at risk. Ask your health care provider about whether you are at high risk for HIV. Your health care provider may recommend a prescription medicine to help prevent HIV infection. If you choose to take medicine to prevent HIV, you should first get tested for HIV. You should then be tested every 3 months for as long as you are taking the medicine. Pregnancy If you are about to stop having your period (premenopausal) and you may become pregnant, seek counseling before you get pregnant. Take 400 to 800 micrograms (mcg) of folic acid every day if you become pregnant. Ask for birth control (contraception) if you want to prevent pregnancy. Osteoporosis and menopause Osteoporosis is a disease in which the bones lose minerals and strength with aging. This can result in bone fractures. If you are 19 years old or older, or if you are at risk for osteoporosis and fractures, ask your health care provider if you should: Be screened for bone loss. Take a calcium or vitamin D supplement to lower your risk of fractures. Be given hormone replacement therapy (HRT) to treat symptoms of menopause. Follow these instructions at home: Alcohol use Do not drink alcohol if: Your health care provider tells you not to drink. You are pregnant, may be pregnant, or are planning to become  pregnant. If you drink alcohol: Limit how much you have to: 0-1 drink a day. Know how much alcohol is in your drink. In the [place] one drink equals one 12 oz bottle of beer (355 mL), one 5 oz glass of wine (148 mL), or one 1 oz glass of hard liquor (44 mL). Lifestyle Do not use any products that contain nicotine or tobacco. These products include cigarettes, chewing tobacco, and vaping devices, such as e-cigarettes. If you need help quitting, ask your health care provider. Do not use street drugs. Do not share needles. Ask your health care provider for help if you need support or information about quitting drugs. General instructions Schedule regular health, dental, and eye exams. Stay current with your vaccines. Tell your health care provider if: You often feel depressed. You have ever been abused or do not feel safe at home. Summary Adopting a healthy lifestyle and getting preventive care are important in promoting health and wellness. Follow your health care provider's instructions about healthy diet, exercising, and getting tested or screened for diseases. Follow your health care provider's instructions on monitoring your cholesterol and blood pressure. This information is not intended to replace advice given to you by your health care provider. Make sure you discuss any questions you have with your health care provider.  Document Revised: 03/14/2021 Document Reviewed: 03/14/2021 Elsevier Patient Education  2023 [employer].

## 2022-07-26 NOTE — Assessment & Plan Note (Signed)
Non pharmacologic treatment recommended for now. Further recommendations will be given according to 10 years CVD risk score and lipid panel numbers. 

## 2022-10-08 ENCOUNTER — Other Ambulatory Visit: Payer: Self-pay | Admitting: Family Medicine

## 2022-10-26 ENCOUNTER — Telehealth: Payer: Self-pay | Admitting: Family Medicine

## 2022-10-26 ENCOUNTER — Other Ambulatory Visit: Payer: Self-pay | Admitting: Family Medicine

## 2022-10-26 DIAGNOSIS — I10 Essential (primary) hypertension: Secondary | ICD-10-CM

## 2022-10-26 NOTE — Telephone Encounter (Signed)
Patient would like refill on amLODipine (NORVASC) 2.5 MG tablet   Patient Stated that they are giving her 5mg  but she is not sure why. She wants 2.5mg  with plenty of refills to be sent to the pharmacy.     Please send to    [id number] - [place], [place] - [address] AT [facility] [address] [facility] [address] Phone: [phone]  Fax: [phone]       Please advise

## 2022-10-27 MED ORDER — AMLODIPINE BESYLATE 5 MG PO TABS: ORAL_TABLET | ORAL | 3 refills | Status: DC

## 2022-10-27 MED ORDER — AMLODIPINE BESYLATE 2.5 MG PO TABS: 2.5000 mg | ORAL_TABLET | Freq: Two times a day (BID) | ORAL | 2 refills | Status: DC

## 2022-10-27 NOTE — Telephone Encounter (Signed)
Rx sent 

## 2022-10-27 NOTE — Addendum Note (Signed)
Addended by: Kathreen Devoid on: 10/27/2022 10:26 AM   Modules accepted: Orders

## 2022-10-27 NOTE — Telephone Encounter (Signed)
I spoke with patient. Insurance won't cover 2.5 mg twice daily. Rx for 5 mg to take 1/2 tab bid sent in.

## 2022-11-01 ENCOUNTER — Encounter: Payer: Self-pay | Admitting: Family Medicine

## 2022-11-01 DIAGNOSIS — E063 Autoimmune thyroiditis: Secondary | ICD-10-CM | POA: Insufficient documentation

## 2022-11-20 ENCOUNTER — Ambulatory Visit (INDEPENDENT_AMBULATORY_CARE_PROVIDER_SITE_OTHER): Payer: Managed Care, Other (non HMO)

## 2022-11-20 DIAGNOSIS — Z23 Encounter for immunization: Secondary | ICD-10-CM | POA: Diagnosis not present

## 2023-02-07 ENCOUNTER — Encounter: Payer: Self-pay | Admitting: Family Medicine

## 2023-08-06 ENCOUNTER — Encounter: Payer: Managed Care, Other (non HMO) | Admitting: Family Medicine

## 2023-08-13 ENCOUNTER — Ambulatory Visit (INDEPENDENT_AMBULATORY_CARE_PROVIDER_SITE_OTHER): Payer: Managed Care, Other (non HMO) | Admitting: Family Medicine

## 2023-08-13 ENCOUNTER — Encounter: Payer: Self-pay | Admitting: Family Medicine

## 2023-08-13 VITALS — BP 130/90 | HR 70 | Temp 98.3°F | Resp 16 | Ht 62.5 in | Wt 177.0 lb

## 2023-08-13 DIAGNOSIS — R7303 Prediabetes: Secondary | ICD-10-CM

## 2023-08-13 DIAGNOSIS — Z0000 Encounter for general adult medical examination without abnormal findings: Secondary | ICD-10-CM | POA: Insufficient documentation

## 2023-08-13 DIAGNOSIS — E785 Hyperlipidemia, unspecified: Secondary | ICD-10-CM

## 2023-08-13 DIAGNOSIS — E063 Autoimmune thyroiditis: Secondary | ICD-10-CM | POA: Diagnosis not present

## 2023-08-13 DIAGNOSIS — I10 Essential (primary) hypertension: Secondary | ICD-10-CM

## 2023-08-13 LAB — LIPID PANEL
Cholesterol: 200 mg/dL (ref 0–200)
HDL: 64.2 mg/dL (ref >39.00)
LDL Cholesterol: 120 mg/dL — ABNORMAL HIGH (ref 0–99)
NonHDL: 135.43
Total CHOL/HDL Ratio: 3
Triglycerides: 77 mg/dL (ref 0.0–149.0)
VLDL: 15.4 mg/dL (ref 0.0–40.0)

## 2023-08-13 LAB — COMPREHENSIVE METABOLIC PANEL
ALT: 14 U/L (ref 0–35)
AST: 15 U/L (ref 0–37)
Albumin: 4 g/dL (ref 3.5–5.2)
Alkaline Phosphatase: 68 U/L (ref 39–117)
BUN: 15 mg/dL (ref 6–23)
CO2: 31 meq/L (ref 19–32)
Calcium: 9.2 mg/dL (ref 8.4–10.5)
Chloride: 100 meq/L (ref 96–112)
Creatinine, Ser: 0.63 mg/dL (ref 0.40–1.20)
GFR: 96.68 mL/min (ref >60.00)
Glucose, Bld: 87 mg/dL (ref 70–99)
Potassium: 3.4 meq/L — ABNORMAL LOW (ref 3.5–5.1)
Sodium: 140 meq/L (ref 135–145)
Total Bilirubin: 0.6 mg/dL (ref 0.2–1.2)
Total Protein: 7 g/dL (ref 6.0–8.3)

## 2023-08-13 LAB — TSH: TSH: 1.44 u[IU]/mL (ref 0.35–5.50)

## 2023-08-13 LAB — HEMOGLOBIN A1C: Hgb A1c MFr Bld: 5.7 % (ref 4.6–6.5)

## 2023-08-13 LAB — T4, FREE: Free T4: 0.85 ng/dL (ref 0.60–1.60)

## 2023-08-13 NOTE — Progress Notes (Unsigned)
HPI: [name] is a 62 y.o. female with a PMHx significant for HTN, GAD, fatigue, seasonal allergies and HLD among somge, who is here today for her routine physical.  Last CPE: 07/26/2022  Exercise: She says she is walking ~5 miles per week and swimming ~3x/week.  Diet: She is cooking at home, eating vegetables daily, and eating mainly chicken and red meat. She is snacking on nuts. She is trying to decrease sugar and carbohydrate intake.  Sleep: She reports she sleeps 6 hours per night.  Alcohol Use: She says she does not drink alcohol.  Smoking: never Vision: She does not regularly see an eye doctor.  Dental: UTD on routine dental care.  She is due for both a mammogram and a colonoscopy.    Immunization History  Administered Date(s) Administered   Td 08/21/2008   Tdap 09/10/2019   Zoster Recombinant(Shingrix) 07/26/2022, 11/20/2022   Health Maintenance  Topic Date Due   Colonoscopy  10/15/2021   COVID-19 Vaccine (1 - 2023-24 season) 08/29/2023 (Originally 07/08/2023)   INFLUENZA VACCINE  08/10/2025 (Originally 06/07/2023)   MAMMOGRAM  09/28/2023   Cervical Cancer Screening (HPV/Pap Cotest)  09/27/2024   DTaP/Tdap/Td (3 - Td or Tdap) 09/09/2029   Hepatitis C Screening  Completed   HIV Screening  Completed   Zoster Vaccines- Shingrix  Completed   HPV VACCINES  Aged Out   Chronic medical problems:   Hypertension:  Medications: She is currently taking amlodipine 2.5 mg bid.  BP readings at home: She is occasionally checking at home. She says her diastolic readings are sometimes high.  Side effects: none  Negative for unusual or severe headache, visual changes, exertional chest pain, dyspnea,  focal weakness, or edema.  Lab Results  Component Value Date   CREATININE 0.68 07/26/2022   BUN 15 07/26/2022   NA 138 07/26/2022   K 3.4 (L) 07/26/2022   CL 100 07/26/2022   CO2 30 07/26/2022   HLD: *** Lab Results  Component Value Date   CHOL 177 07/26/2022   HDL  55.00 07/26/2022   LDLCALC 105 (H) 07/26/2022   LDLDIRECT 132.4 08/21/2008   LDLDIRECT 132.4 08/21/2008   TRIG 85.0 07/26/2022   CHOLHDL 3 07/26/2022   Lab Results  Component Value Date   TSH 1.45 07/26/2022    Concerns today:   She reports some pain in her left ear after swimming. She endorses a brief episode of associated vertigo and says she had been told by a different provider she had an infection and a "hole" in her ear in the past.   She has a history of thyroiditis and asks about following up on this today.  Review of Systems  Constitutional:  Positive for fatigue. Negative for activity change, appetite change and fever.  HENT:  Negative for hearing loss, mouth sores, sore throat and trouble swallowing.   Eyes:  Negative for redness and visual disturbance.  Respiratory:  Negative for cough, shortness of breath and wheezing.   Cardiovascular:  Negative for chest pain and leg swelling.  Gastrointestinal:  Negative for abdominal pain, nausea and vomiting.       No changes in bowel habits.  Endocrine: Negative for cold intolerance, heat intolerance, polydipsia, polyphagia and polyuria.  Genitourinary:  Negative for decreased urine volume, dysuria and hematuria.  Musculoskeletal:  Negative for gait problem and myalgias.  Skin:  Negative for color change and rash.  Allergic/Immunologic: Positive for environmental allergies.  Neurological:  Negative for seizures, syncope, weakness and headaches.  Hematological:  Negative for adenopathy. Does not bruise/bleed easily.  Psychiatric/Behavioral:  Negative for confusion and hallucinations. The patient is nervous/anxious.   All other systems reviewed and are negative.  Current Outpatient Medications on File Prior to Visit  Medication Sig Dispense Refill   amLODipine (NORVASC) 5 MG tablet Take 1/2 tablet by mouth twice daily. 90 tablet 3   No current facility-administered medications on file prior to visit.   Past Medical History:   Diagnosis Date   Anal fissure    hx of    Anxiety    GERD (gastroesophageal reflux disease)    H. pylori infection    Hx: UTI (urinary tract infection)    Hyperlipidemia    Hypertension    Irritable bowel syndrome (IBS)    Past Surgical History:  Procedure Laterality Date   ABDOMINAL HYSTERECTOMY  10/2016   Allergies  Allergen Reactions   Penicillins Rash    Has patient had a PCN reaction causing immediate rash, facial/tongue/throat swelling, SOB or lightheadedness with hypotension: No Has patient had a PCN reaction causing severe rash involving mucus membranes or skin necrosis: No Has patient had a PCN reaction that required hospitalization No Has patient had a PCN reaction occurring within the last 10 years: No If all of the above answers are "NO", then may proceed with Cephalosporin use.    Family History  Problem Relation Age of Onset   Diabetes Mother    Heart disease Mother    Stroke Mother    Hypertension Mother    Heart disease Father    Colon cancer Neg Hx    Social History   Socioeconomic History   Marital status: Married    Spouse name: Not on file   Number of children: 3   Years of education: Not on file   Highest education level: Not on file  Occupational History   Not on file  Tobacco Use   Smoking status: Never   Smokeless tobacco: Never  Substance and Sexual Activity   Alcohol use: No   Drug use: No   Sexual activity: Yes  Other Topics Concern   Not on file  Social History Narrative   Caffeine daily    Social Determinants of Health   Financial Resource Strain: Not on file  Food Insecurity: Not on file  Transportation Needs: Not on file  Physical Activity: Not on file  Stress: Not on file  Social Connections: Unknown (06/12/2022)   [facility]   Social Network    Social Network: Not on file   Vitals:   08/13/23 1106 08/13/23 1159  BP: (!) 136/90 (!) 130/90  Pulse: 70   Resp: 16   Temp: 98.3 F (36.8  C)   SpO2: 98%    Body mass index is 31.86 kg/m.  Wt Readings from Last 3 Encounters:  08/13/23 177 lb (80.3 kg)  07/26/22 186 lb 8 oz (84.6 kg)  05/24/21 189 lb (85.7 kg)   Physical Exam Vitals and nursing note reviewed.  Constitutional:      General: She is not in acute distress.    Appearance: She is well-developed and well-groomed.  HENT:     Head: Normocephalic and atraumatic.     Right Ear: Hearing, tympanic membrane, ear canal and external ear normal.     Left Ear: Hearing, tympanic membrane, ear canal and external ear normal.     Mouth/Throat:     Mouth: Mucous membranes are moist.     Pharynx: Oropharynx is clear.  Uvula midline.  Eyes:     Extraocular Movements: Extraocular movements intact.     Conjunctiva/sclera: Conjunctivae normal.     Pupils: Pupils are equal, round, and reactive to light.  Neck:     Thyroid: No thyroid mass or thyromegaly.     Trachea: No tracheal deviation.  Cardiovascular:     Rate and Rhythm: Normal rate and regular rhythm.     Pulses:          Dorsalis pedis pulses are 2+ on the right side and 2+ on the left side.     Heart sounds: S1 normal. No murmur heard. Pulmonary:     Effort: Pulmonary effort is normal. No respiratory distress.     Breath sounds: Normal breath sounds.  Abdominal:     Palpations: Abdomen is soft. There is no hepatomegaly or mass.     Tenderness: There is no abdominal tenderness.  Genitourinary:    Comments: Deferred to gyn. Musculoskeletal:     Right lower leg: No edema.     Left lower leg: No edema.     Comments: No signs of synovitis appreciated.  Lymphadenopathy:     Cervical: No cervical adenopathy.     Upper Body:     Right upper body: No supraclavicular adenopathy.     Left upper body: No supraclavicular adenopathy.  Skin:    General: Skin is warm.     Findings: No erythema or rash.  Neurological:     General: No focal deficit present.     Mental Status: She is alert and oriented to person,  place, and time.     Cranial Nerves: No cranial nerve deficit.     Coordination: Coordination normal.     Gait: Gait normal.     Deep Tendon Reflexes:     Reflex Scores:      Bicep reflexes are 2+ on the right side and 2+ on the left side.      Patellar reflexes are 2+ on the right side and 2+ on the left side. Psychiatric:        Mood and Affect: Affect normal. Mood is anxious.   ASSESSMENT AND PLAN:  Ms. Jensyn Shave was here today for her annual physical examination.  Orders Placed This Encounter  Procedures   HM MAMMOGRAPHY   US THYROID   HM PAP SMEAR   T4, free   TSH   Hemoglobin A1c   Comprehensive metabolic panel   Lipid panel   ***  Routine general medical examination at a health care facility Assessment & Plan: We discussed the importance of regular physical activity and healthy diet for prevention of chronic illness and/or complications. Preventive guidelines reviewed. Vaccination up to date, declined flu vaccine. Gastroenterologist's contact information given, so she can call and schedule her colonoscopy. She is overdue for mammogram, instructed to arrange appt with her gynecologist. Ca++ and vit D supplementation recommended. Next CPE in a year.   Thyroiditis, chronic, lymphocytic Assessment & Plan: Left thyroid nodule seen on [place], status post FNA on 08/25/2021, it did show benign follicular nodule, adenomatosis colloid goiter with cystic degeneration. Thyroid US will be arranged. Further recommendation will be given according to lab results.  Orders: -     T4, free; Future -     TSH; Future -     US THYROID; Future  Hyperlipidemia, unspecified hyperlipidemia type Assessment & Plan: Continue nonpharmacologic treatment. Further recommendations will be given according to 10 years CVD risk score and lipid panel numbers.  Orders: -     Comprehensive metabolic panel; Future -     Lipid panel; Future  Prediabetes Assessment & Plan: Hemoglobin A1c was  6.0 in 07/2022. Consistency with a healthy lifestyle encouraged for diabetes prevention. Further recommendation will be given according to hemoglobin A1c result.  Orders: -     Hemoglobin A1c; Future  Hypertension, essential Assessment & Plan: BP mildly elevated today, reporting lower readings at home. Continue Amlodipine 1/2 tablet twice daily daily and low-salt diet. Recommend monitoring BP at least twice per month. As far as problem is stable, annual follow-up is appropriate. She is overdue for eye exam.    Return in 1 year (on 08/12/2024) for CPE, chronic problems, Labs.  I, Rolla Etienne Wierda, acting as a scribe for Juan Kissoon [place], MD., have documented all relevant documentation on the behalf of Wilfrido Luedke [place], MD, as directed by  Akim Watkinson [place], MD while in the presence of Maitland Muhlbauer [place], MD.   I, Tamicka Shimon [place], MD, have reviewed all documentation for this visit. The documentation on 08/13/23 for the exam, diagnosis, procedures, and orders are all accurate and complete.  Theresea Trautmann G. [place], MD  [facility]. [place] office.

## 2023-08-13 NOTE — Assessment & Plan Note (Signed)
We discussed the importance of regular physical activity and healthy diet for prevention of chronic illness and/or complications. Preventive guidelines reviewed. Vaccination up to date, declined flu vaccine. Gastroenterologist's contact information given, so she can call and schedule her colonoscopy. She is overdue for mammogram, instructed to arrange appt with her gynecologist. Ca++ and vit D supplementation recommended. Next CPE in a year.

## 2023-08-13 NOTE — Assessment & Plan Note (Signed)
Left thyroid nodule seen on [place], status post FNA on 08/25/2021, it did show benign follicular nodule, adenomatosis colloid goiter with cystic degeneration. Thyroid US will be arranged. Further recommendation will be given according to lab results.

## 2023-08-13 NOTE — Patient Instructions (Addendum)
A few things to remember from today's visit:  Routine general medical examination at a health care facility  Thyroiditis, chronic, lymphocytic - Plan: T4, free, TSH  Hyperlipidemia, unspecified hyperlipidemia type - Plan: Comprehensive metabolic panel, Lipid panel  Prediabetes - Plan: Hemoglobin A1c  Colon cancer screening  Please call gastroenterologist's office to schedule colonoscopy. Please call our office at [phone] to schedule this appointment or to update your records at your earliest convenience.   Monitor blood pressures at home at least 2 times per month. Goal blood pressure 130/80 or less. No changes in Amlodipine dose.  If you need refills for medications you take chronically, please call your pharmacy. Do not use My Chart to request refills or for acute issues that need immediate attention. If you send a my chart message, it may take a few days to be addressed, specially if I am not in the office.  Please be sure medication list is accurate. If a new problem present, please set up appointment sooner than planned today.  Health Maintenance, Female Adopting a healthy lifestyle and getting preventive care are important in promoting health and wellness. Ask your health care provider about: The right schedule for you to have regular tests and exams. Things you can do on your own to prevent diseases and keep yourself healthy. What should I know about diet, weight, and exercise? Eat a healthy diet  Eat a diet that includes plenty of vegetables, fruits, low-fat dairy products, and lean protein. Do not eat a lot of foods that are high in solid fats, added sugars, or sodium. Maintain a healthy weight Body mass index (BMI) is used to identify weight problems. It estimates body fat based on height and weight. Your health care provider can help determine your BMI and help you achieve or maintain a healthy weight. Get regular exercise Get regular exercise. This is one of  the most important things you can do for your health. Most adults should: Exercise for at least 150 minutes each week. The exercise should increase your heart rate and make you sweat (moderate-intensity exercise). Do strengthening exercises at least twice a week. This is in addition to the moderate-intensity exercise. Spend less time sitting. Even light physical activity can be beneficial. Watch cholesterol and blood lipids Have your blood tested for lipids and cholesterol at 60 years of age, then have this test every 5 years. Have your cholesterol levels checked more often if: Your lipid or cholesterol levels are high. You are older than 60 years of age. You are at high risk for heart disease. What should I know about cancer screening? Depending on your health history and family history, you may need to have cancer screening at various ages. This may include screening for: Breast cancer. Cervical cancer. Colorectal cancer. Skin cancer. Lung cancer. What should I know about heart disease, diabetes, and high blood pressure? Blood pressure and heart disease High blood pressure causes heart disease and increases the risk of stroke. This is more likely to develop in people who have high blood pressure readings or are overweight. Have your blood pressure checked: Every 3-5 years if you are 59-52 years of age. Every year if you are 64 years old or older. Diabetes Have regular diabetes screenings. This checks your fasting blood sugar level. Have the screening done: Once every three years after age 37 if you are at a normal weight and have a low risk for diabetes. More often and at a younger age if you are overweight  or have a high risk for diabetes. What should I know about preventing infection? Hepatitis B If you have a higher risk for hepatitis B, you should be screened for this virus. Talk with your health care provider to find out if you are at risk for hepatitis B infection. Hepatitis  C Testing is recommended for: Everyone born from [birth year] through 1963. Anyone with known risk factors for hepatitis C. Sexually transmitted infections (STIs) Get screened for STIs, including gonorrhea and chlamydia, if: You are sexually active and are younger than 61 years of age. You are older than 60 years of age and your health care provider tells you that you are at risk for this type of infection. Your sexual activity has changed since you were last screened, and you are at increased risk for chlamydia or gonorrhea. Ask your health care provider if you are at risk. Ask your health care provider about whether you are at high risk for HIV. Your health care provider may recommend a prescription medicine to help prevent HIV infection. If you choose to take medicine to prevent HIV, you should first get tested for HIV. You should then be tested every 3 months for as long as you are taking the medicine. Pregnancy If you are about to stop having your period (premenopausal) and you may become pregnant, seek counseling before you get pregnant. Take 400 to 800 micrograms (mcg) of folic acid every day if you become pregnant. Ask for birth control (contraception) if you want to prevent pregnancy. Osteoporosis and menopause Osteoporosis is a disease in which the bones lose minerals and strength with aging. This can result in bone fractures. If you are 58 years old or older, or if you are at risk for osteoporosis and fractures, ask your health care provider if you should: Be screened for bone loss. Take a calcium or vitamin D supplement to lower your risk of fractures. Be given hormone replacement therapy (HRT) to treat symptoms of menopause. Follow these instructions at home: Alcohol use Do not drink alcohol if: Your health care provider tells you not to drink. You are pregnant, may be pregnant, or are planning to become pregnant. If you drink alcohol: Limit how much you have to: 0-1 drink a day. Know  how much alcohol is in your drink. In the [place] one drink equals one 12 oz bottle of beer (355 mL), one 5 oz glass of wine (148 mL), or one 1 oz glass of hard liquor (44 mL). Lifestyle Do not use any products that contain nicotine or tobacco. These products include cigarettes, chewing tobacco, and vaping devices, such as e-cigarettes. If you need help quitting, ask your health care provider. Do not use street drugs. Do not share needles. Ask your health care provider for help if you need support or information about quitting drugs. General instructions Schedule regular health, dental, and eye exams. Stay current with your vaccines. Tell your health care provider if: You often feel depressed. You have ever been abused or do not feel safe at home. Summary Adopting a healthy lifestyle and getting preventive care are important in promoting health and wellness. Follow your health care provider's instructions about healthy diet, exercising, and getting tested or screened for diseases. Follow your health care provider's instructions on monitoring your cholesterol and blood pressure. This information is not intended to replace advice given to you by your health care provider. Make sure you discuss any questions you have with your health care provider. Document Revised: 03/14/2021 Document Reviewed:  03/14/2021 Elsevier Patient Education  2024 [employer].

## 2023-08-13 NOTE — Assessment & Plan Note (Signed)
Hemoglobin A1c was 6.0 in 07/2022. Consistency with a healthy lifestyle encouraged for diabetes prevention. Further recommendation will be given according to hemoglobin A1c result.

## 2023-08-13 NOTE — Assessment & Plan Note (Signed)
Continue nonpharmacologic treatment. Further recommendations will be given according to 10 years CVD risk score and lipid panel numbers. 

## 2023-08-13 NOTE — Assessment & Plan Note (Signed)
BP mildly elevated today, reporting lower readings at home. Continue Amlodipine 1/2 tablet twice daily daily and low-salt diet. Recommend monitoring BP at least twice per month. As far as problem is stable, annual follow-up is appropriate. She is overdue for eye exam.

## 2023-08-20 ENCOUNTER — Ambulatory Visit
Admission: RE | Admit: 2023-08-20 | Discharge: 2023-08-20 | Disposition: A | Discharge status: Home / Self Care | Payer: Managed Care, Other (non HMO) | Source: Ambulatory Visit | Attending: Family Medicine | Admitting: Family Medicine

## 2023-08-20 DIAGNOSIS — E063 Autoimmune thyroiditis: Secondary | ICD-10-CM

## 2023-10-14 ENCOUNTER — Other Ambulatory Visit: Payer: Self-pay | Admitting: Family Medicine

## 2024-08-05 ENCOUNTER — Ambulatory Visit: Admitting: Family Medicine

## 2024-08-06 ENCOUNTER — Encounter: Payer: Self-pay | Admitting: Family Medicine

## 2024-08-06 ENCOUNTER — Ambulatory Visit: Admitting: Family Medicine

## 2024-08-06 VITALS — BP 150/90 | HR 70 | Temp 97.9°F | Resp 16 | Ht 62.5 in | Wt 185.0 lb

## 2024-08-06 DIAGNOSIS — Z6833 Body mass index (BMI) 33.0-33.9, adult: Secondary | ICD-10-CM

## 2024-08-06 DIAGNOSIS — E66811 Obesity, class 1: Secondary | ICD-10-CM

## 2024-08-06 DIAGNOSIS — H1131 Conjunctival hemorrhage, right eye: Secondary | ICD-10-CM

## 2024-08-06 DIAGNOSIS — E6609 Other obesity due to excess calories: Secondary | ICD-10-CM

## 2024-08-06 DIAGNOSIS — I10 Essential (primary) hypertension: Secondary | ICD-10-CM | POA: Diagnosis not present

## 2024-08-06 DIAGNOSIS — R7303 Prediabetes: Secondary | ICD-10-CM | POA: Diagnosis not present

## 2024-08-06 DIAGNOSIS — R350 Frequency of micturition: Secondary | ICD-10-CM

## 2024-08-06 NOTE — Assessment & Plan Note (Signed)
 She just made some dietary changes a few days ago, frustrated because she is not noticing any weight loss. She is not exercising regularly. Offered referral to healthy weight and wellness clinic, she prefers to hold on it for now. Encouraged consistency with a healthful diet and regular physical activity.

## 2024-08-06 NOTE — Assessment & Plan Note (Signed)
 BP is not adequately controlled. She just increased dose of amlodipine  from 2.5 mg twice daily to 5 mg twice daily about 2 days ago. No changes in current management today. Continue low-salt diet. Continue monitoring BP regularly.

## 2024-08-06 NOTE — Assessment & Plan Note (Signed)
 Last hemoglobin A1c 5.7 in 08/2023. Concerned about glucose level, she requested hemoglobin A1c today. Further recommendation will be given according to lab results.

## 2024-08-06 NOTE — Patient Instructions (Addendum)
 A few things to remember from today's visit:  Hypertension, essential - Plan: Comprehensive metabolic panel with GFR, CBC  Subconjunctival hemorrhage of right eye - Plan: CBC, Protime-INR  Class 1 obesity due to excess calories with serious comorbidity and body mass index (BMI) of 33.0 to 33.9 in adult  Prediabetes - Plan: Hemoglobin A1c  Urine frequency - Plan: Urinalysis with Culture Reflex  Continue Amlodipine  5 mg 2 times daily. Blood pressure next visit.  If you need refills for medications you take chronically, please call your pharmacy. Do not use My Chart to request refills or for acute issues that need immediate attention. If you send a my chart message, it may take a few days to be addressed, specially if I am not in the office.  Please be sure medication list is accurate. If a new problem present, please set up appointment sooner than planned today.

## 2024-08-06 NOTE — Progress Notes (Unsigned)
 ACUTE VISIT Chief Complaint  Patient presents with   Eye Problem    Red eye popped blood vessel    HPI: [name] is a 63 y.o. female, who is here today complaining of *** HPI Discussed the use of AI scribe software for clinical note transcription with the patient, who gave verbal consent to proceed.  History of Present Illness [name] is a 63 year old female with hypertension who presents with a red eye and elevated blood pressure.  She noticed a red eye on Tuesday while working on the computer, which was pointed out by a Radio broadcast assistant. An eye doctor found no issues with the eye but recommended checking her blood pressure. This is the second occurrence of a red eye in the past two weeks.  She has a history of hypertension and reports that her blood pressure has been elevated recently. Previously, she was taking amlodipine  5 mg twice daily, but her blood pressure remained high, reaching 140/95 mmHg. Since Monday, she increased her dose to 10 mg daily, which has improved her blood pressure to 132/85 mmHg, though the diastolic pressure remains slightly elevated. She experiences severe pressure and pain in her left ear, which she associates with high blood pressure, and describes feeling 'worrying, anxiety' when her blood pressure is high. No unusual headaches, chest pain, or palpitations. Her last blood pressure reading was 108/87 mmHg, and she did not experience dizziness at that time. Lab Results  Component Value Date   NA 140 08/13/2023   CL 100 08/13/2023   K 3.4 (L) 08/13/2023   CO2 31 08/13/2023   BUN 15 08/13/2023   CREATININE 0.63 08/13/2023   GFR 96.68 08/13/2023   CALCIUM 9.2 08/13/2023   ALBUMIN 4.0 08/13/2023   GLUCOSE 87 08/13/2023    She reports urinary symptoms including a feeling of incomplete bladder emptying and increased frequency, especially at night, for the past three weeks. No burning sensation or blood in the urine, but notes that her urine sometimes  appears cloudy.  She is concerned about her weight and has been trying to lose weight by cutting carbohydrates since Monday. She walks her dog and is active at work, walking for about 30 minutes to an hour daily.   Lab Results  Component Value Date   ALT 14 08/13/2023   AST 15 08/13/2023   ALKPHOS 68 08/13/2023   BILITOT 0.6 08/13/2023   Lab Results  Component Value Date   HGBA1C 5.7 08/13/2023     Review of Systems See other pertinent positives and negatives in HPI.  Current Outpatient Medications on File Prior to Visit  Medication Sig Dispense Refill   amLODipine  (NORVASC ) 5 MG tablet TAKE 1/2 TABLET TWICE A DAY BY MOUTH 90 tablet 3   No current facility-administered medications on file prior to visit.    Past Medical History:  Diagnosis Date   Anal fissure    hx of    Anxiety    GERD (gastroesophageal reflux disease)    H. pylori infection    Hx: UTI (urinary tract infection)    Hyperlipidemia    Hypertension    Irritable bowel syndrome (IBS)    Allergies  Allergen Reactions   Penicillins Rash    Has patient had a PCN reaction causing immediate rash, facial/tongue/throat swelling, SOB or lightheadedness with hypotension: No Has patient had a PCN reaction causing severe rash involving mucus membranes or skin necrosis: No Has patient had a PCN reaction that required hospitalization No Has patient had  a PCN reaction occurring within the last 10 years: No If all of the above answers are NO, then may proceed with Cephalosporin use.     Social History   Socioeconomic History   Marital status: Married    Spouse name: Not on file   Number of children: 3   Years of education: Not on file   Highest education level: Not on file  Occupational History   Not on file  Tobacco Use   Smoking status: Never   Smokeless tobacco: Never  Substance and Sexual Activity   Alcohol use: No   Drug use: No   Sexual activity: Yes  Other Topics Concern   Not on file   Social History Narrative   Caffeine daily    Social Drivers of Health   Financial Resource Strain: Not on file  Food Insecurity: Not on file  Transportation Needs: Not on file  Physical Activity: Not on file  Stress: Not on file  Social Connections: Unknown (06/12/2022)   Received from [facility]   Social Network    Social Network: Not on file    Vitals:   08/06/24 1541 08/06/24 1623  BP: (!) 160/86 (!) 150/90  Pulse: 70   Resp: 16   Temp: 97.9 F (36.6 C)   SpO2: 96%    Body mass index is 33.3 kg/m.  Physical Exam Vitals and nursing note reviewed.  Constitutional:      General: She is not in acute distress.    Appearance: She is well-developed.  HENT:     Head: Normocephalic and atraumatic.     Mouth/Throat:     Mouth: Mucous membranes are moist.     Pharynx: Oropharynx is clear.  Eyes:     Conjunctiva/sclera:     Right eye: Hemorrhage present.  Cardiovascular:     Rate and Rhythm: Normal rate and regular rhythm.     Pulses:          Dorsalis pedis pulses are 2+ on the right side and 2+ on the left side.     Heart sounds: No murmur heard. Pulmonary:     Effort: Pulmonary effort is normal. No respiratory distress.     Breath sounds: Normal breath sounds.  Abdominal:     Palpations: Abdomen is soft. There is no hepatomegaly or mass.     Tenderness: There is no abdominal tenderness.  Lymphadenopathy:     Cervical: No cervical adenopathy.  Skin:    General: Skin is warm.     Findings: No erythema or rash.  Neurological:     General: No focal deficit present.     Mental Status: She is alert and oriented to person, place, and time.     Cranial Nerves: No cranial nerve deficit.     Gait: Gait normal.  Psychiatric:        Mood and Affect: Affect normal. Mood is anxious.     Comments: Well groomed, good eye contact.     ASSESSMENT AND PLAN: Hypertension, essential Assessment & Plan: BP is not adequately controlled. She just increased dose of  amlodipine  from 2.5 mg twice daily to 5 mg twice daily about 2 days ago. No changes in current management today. Continue low-salt diet. Continue monitoring BP regularly.  Orders: -     Comprehensive metabolic panel with GFR; Future -     CBC; Future  Subconjunctival hemorrhage of right eye -     CBC; Future -     Protime-INR; Future  Class  1 obesity due to excess calories with serious comorbidity and body mass index (BMI) of 33.0 to 33.9 in adult  Prediabetes Assessment & Plan: Last hemoglobin A1c 5.7 in 08/2023. Concerned about glucose level, she requested hemoglobin A1c today. Further recommendation will be given according to lab results.  Orders: -     Hemoglobin A1c; Future  Urine frequency -     Urinalysis w microscopic + reflex cultur  Class 1 obesity with serious comorbidity and body mass index (BMI) of 34.0 to 34.9 in adult, unspecified obesity type Assessment & Plan: She just made some dietary changes a few days ago, frustrated because she is not noticing any weight loss. She is not exercising regularly. Offered referral to healthy weight and wellness clinic, she prefers to hold on it for now. Encouraged consistency with a healthful diet and regular physical activity.   Problem has been going on for about 3 weeks, no other associated symptoms. History does not suggest UTI, she is concerned about the possibility. We discussed possible etiologies,?  Overactive bladder. UA with urine culture reflex ordered today. *** Return for keep next appointment.  Lofton Leon G. [place], MD  [facility]. [place] office.  Discharge Instructions   None

## 2024-08-07 ENCOUNTER — Ambulatory Visit: Payer: Self-pay | Admitting: Family Medicine

## 2024-08-07 LAB — COMPREHENSIVE METABOLIC PANEL WITH GFR
ALT: 18 IU/L (ref 0–32)
AST: 17 IU/L (ref 0–40)
Albumin: 4.4 g/dL (ref 3.9–4.9)
Alkaline Phosphatase: 93 IU/L (ref 49–135)
BUN/Creatinine Ratio: 16 (ref 12–28)
BUN: 13 mg/dL (ref 8–27)
Bilirubin Total: 0.5 mg/dL (ref 0.0–1.2)
CO2: 22 mmol/L (ref 20–29)
Calcium: 9.5 mg/dL (ref 8.7–10.3)
Chloride: 99 mmol/L (ref 96–106)
Creatinine, Ser: 0.8 mg/dL (ref 0.57–1.00)
Globulin, Total: 2.7 g/dL (ref 1.5–4.5)
Glucose: 87 mg/dL (ref 70–99)
Potassium: 4.6 mmol/L (ref 3.5–5.2)
Sodium: 136 mmol/L (ref 134–144)
Total Protein: 7.1 g/dL (ref 6.0–8.5)
eGFR: 84 mL/min/1.73 (ref >59)

## 2024-08-07 LAB — URINALYSIS W MICROSCOPIC + REFLEX CULTURE
Bacteria, UA: NONE SEEN /HPF
Bilirubin Urine: NEGATIVE
Glucose, UA: NEGATIVE
Hyaline Cast: NONE SEEN /LPF
Ketones, ur: NEGATIVE
Leukocyte Esterase: NEGATIVE
Nitrites, Initial: NEGATIVE
Protein, ur: NEGATIVE
RBC / HPF: NONE SEEN /HPF (ref 0–2)
Specific Gravity, Urine: 1.003 (ref 1.001–1.035)
Squamous Epithelial / HPF: NONE SEEN /HPF (ref <=5)
WBC, UA: NONE SEEN /HPF (ref 0–5)
pH: 6.5 (ref 5.0–8.0)

## 2024-08-07 LAB — CBC
Hematocrit: 41.1 % (ref 34.0–46.6)
Hemoglobin: 13.5 g/dL (ref 11.1–15.9)
MCH: 30.4 pg (ref 26.6–33.0)
MCHC: 32.8 g/dL (ref 31.5–35.7)
MCV: 93 fL (ref 79–97)
Platelets: 327 x10E3/uL (ref 150–450)
RBC: 4.44 x10E6/uL (ref 3.77–5.28)
RDW: 12.4 % (ref 11.7–15.4)
WBC: 6.7 x10E3/uL (ref 3.4–10.8)

## 2024-08-07 LAB — HEMOGLOBIN A1C: Hgb A1c MFr Bld: 6 % (ref 4.6–6.5)

## 2024-08-07 LAB — PROTIME-INR
INR: 1 (ref 0.9–1.2)
Prothrombin Time: 10.5 s (ref 9.1–12.0)

## 2024-08-07 LAB — NO CULTURE INDICATED

## 2024-08-13 ENCOUNTER — Ambulatory Visit (INDEPENDENT_AMBULATORY_CARE_PROVIDER_SITE_OTHER): Admitting: Family Medicine

## 2024-08-13 ENCOUNTER — Ambulatory Visit: Payer: Self-pay | Admitting: Family Medicine

## 2024-08-13 ENCOUNTER — Encounter: Payer: Self-pay | Admitting: Family Medicine

## 2024-08-13 VITALS — BP 130/80 | HR 70 | Temp 98.0°F | Resp 12 | Ht 62.5 in | Wt 182.2 lb

## 2024-08-13 DIAGNOSIS — E785 Hyperlipidemia, unspecified: Secondary | ICD-10-CM | POA: Diagnosis not present

## 2024-08-13 DIAGNOSIS — Z1211 Encounter for screening for malignant neoplasm of colon: Secondary | ICD-10-CM

## 2024-08-13 DIAGNOSIS — Z1329 Encounter for screening for other suspected endocrine disorder: Secondary | ICD-10-CM | POA: Diagnosis not present

## 2024-08-13 DIAGNOSIS — Z130 Encounter for screening for diseases of the blood and blood-forming organs and certain disorders involving the immune mechanism: Secondary | ICD-10-CM | POA: Diagnosis not present

## 2024-08-13 DIAGNOSIS — Z23 Encounter for immunization: Secondary | ICD-10-CM

## 2024-08-13 DIAGNOSIS — Z13228 Encounter for screening for other metabolic disorders: Secondary | ICD-10-CM | POA: Diagnosis not present

## 2024-08-13 DIAGNOSIS — E063 Autoimmune thyroiditis: Secondary | ICD-10-CM | POA: Diagnosis not present

## 2024-08-13 DIAGNOSIS — I10 Essential (primary) hypertension: Secondary | ICD-10-CM | POA: Diagnosis not present

## 2024-08-13 DIAGNOSIS — Z0000 Encounter for general adult medical examination without abnormal findings: Secondary | ICD-10-CM | POA: Diagnosis not present

## 2024-08-13 LAB — LIPID PANEL
Cholesterol: 175 mg/dL (ref 0–200)
HDL: 54.6 mg/dL (ref >39.00)
LDL Cholesterol: 108 mg/dL — ABNORMAL HIGH (ref 0–99)
NonHDL: 120.87
Total CHOL/HDL Ratio: 3
Triglycerides: 64 mg/dL (ref 0.0–149.0)
VLDL: 12.8 mg/dL (ref 0.0–40.0)

## 2024-08-13 LAB — GLUCOSE, RANDOM: Glucose, Bld: 83 mg/dL (ref 70–99)

## 2024-08-13 LAB — TSH: TSH: 1 u[IU]/mL (ref 0.35–5.50)

## 2024-08-13 MED ORDER — AMLODIPINE BESYLATE 5 MG PO TABS: 10.0000 mg | ORAL_TABLET | Freq: Every day | ORAL | 1 refills | Status: DC

## 2024-08-13 NOTE — Assessment & Plan Note (Signed)
 BP has improved. She is on Amlodipine  5 mg bid and would like to continue same regimen. Continue low salt diet. Continue monitoring BP regularly. F/U in 6 months.

## 2024-08-13 NOTE — Assessment & Plan Note (Signed)
 We discussed the importance of regular physical activity and healthy diet for prevention of chronic illness and/or complications. Preventive guidelines reviewed. Vaccination updated, Prevnar 20 given today, does not want flu vaccine. Ca++ and vit D supplementation recommended. She doe snot want mammogram ordered today, planning on establishing with new gynecologist. S/P hysterectomy. Next CPE in a year.

## 2024-08-13 NOTE — Patient Instructions (Addendum)
 A few things to remember from today's visit:  Routine general medical examination at a health care facility  Hyperlipidemia, unspecified hyperlipidemia type - Plan: Lipid panel  Colon cancer screening - Plan: Ambulatory referral to Gastroenterology  Screening for endocrine, metabolic and immunity disorder - Plan: Glucose, random  Thyroiditis, chronic, lymphocytic - Plan: TSH  If you need refills for medications you take chronically, please call your pharmacy. Do not use My Chart to request refills or for acute issues that need immediate attention. If you send a my chart message, it may take a few days to be addressed, specially if I am not in the office.  Please be sure medication list is accurate. If a new problem present, please set up appointment sooner than planned today.  Health Maintenance, Female Adopting a healthy lifestyle and getting preventive care are important in promoting health and wellness. Ask your health care provider about: The right schedule for you to have regular tests and exams. Things you can do on your own to prevent diseases and keep yourself healthy. What should I know about diet, weight, and exercise? Eat a healthy diet  Eat a diet that includes plenty of vegetables, fruits, low-fat dairy products, and lean protein. Do not eat a lot of foods that are high in solid fats, added sugars, or sodium. Maintain a healthy weight Body mass index (BMI) is used to identify weight problems. It estimates body fat based on height and weight. Your health care provider can help determine your BMI and help you achieve or maintain a healthy weight. Get regular exercise Get regular exercise. This is one of the most important things you can do for your health. Most adults should: Exercise for at least 150 minutes each week. The exercise should increase your heart rate and make you sweat (moderate-intensity exercise). Do strengthening exercises at least twice a week. This is in  addition to the moderate-intensity exercise. Spend less time sitting. Even light physical activity can be beneficial. Watch cholesterol and blood lipids Have your blood tested for lipids and cholesterol at 61 years of age, then have this test every 5 years. Have your cholesterol levels checked more often if: Your lipid or cholesterol levels are high. You are older than 61 years of age. You are at high risk for heart disease. What should I know about cancer screening? Depending on your health history and family history, you may need to have cancer screening at various ages. This may include screening for: Breast cancer. Cervical cancer. Colorectal cancer. Skin cancer. Lung cancer. What should I know about heart disease, diabetes, and high blood pressure? Blood pressure and heart disease High blood pressure causes heart disease and increases the risk of stroke. This is more likely to develop in people who have high blood pressure readings or are overweight. Have your blood pressure checked: Every 3-5 years if you are 54-73 years of age. Every year if you are 32 years old or older. Diabetes Have regular diabetes screenings. This checks your fasting blood sugar level. Have the screening done: Once every three years after age 74 if you are at a normal weight and have a low risk for diabetes. More often and at a younger age if you are overweight or have a high risk for diabetes. What should I know about preventing infection? Hepatitis B If you have a higher risk for hepatitis B, you should be screened for this virus. Talk with your health care provider to find out if you are at  risk for hepatitis B infection. Hepatitis C Testing is recommended for: Everyone born from [birth year] through 1963. Anyone with known risk factors for hepatitis C. Sexually transmitted infections (STIs) Get screened for STIs, including gonorrhea and chlamydia, if: You are sexually active and are younger than 61 years of  age. You are older than 61 years of age and your health care provider tells you that you are at risk for this type of infection. Your sexual activity has changed since you were last screened, and you are at increased risk for chlamydia or gonorrhea. Ask your health care provider if you are at risk. Ask your health care provider about whether you are at high risk for HIV. Your health care provider may recommend a prescription medicine to help prevent HIV infection. If you choose to take medicine to prevent HIV, you should first get tested for HIV. You should then be tested every 3 months for as long as you are taking the medicine. Pregnancy If you are about to stop having your period (premenopausal) and you may become pregnant, seek counseling before you get pregnant. Take 400 to 800 micrograms (mcg) of folic acid every day if you become pregnant. Ask for birth control (contraception) if you want to prevent pregnancy. Osteoporosis and menopause Osteoporosis is a disease in which the bones lose minerals and strength with aging. This can result in bone fractures. If you are 53 years old or older, or if you are at risk for osteoporosis and fractures, ask your health care provider if you should: Be screened for bone loss. Take a calcium or vitamin D supplement to lower your risk of fractures. Be given hormone replacement therapy (HRT) to treat symptoms of menopause. Follow these instructions at home: Alcohol use Do not drink alcohol if: Your health care provider tells you not to drink. You are pregnant, may be pregnant, or are planning to become pregnant. If you drink alcohol: Limit how much you have to: 0-1 drink a day. Know how much alcohol is in your drink. In the [place] one drink equals one 12 oz bottle of beer (355 mL), one 5 oz glass of wine (148 mL), or one 1 oz glass of hard liquor (44 mL). Lifestyle Do not use any products that contain nicotine or tobacco. These products include  cigarettes, chewing tobacco, and vaping devices, such as e-cigarettes. If you need help quitting, ask your health care provider. Do not use street drugs. Do not share needles. Ask your health care provider for help if you need support or information about quitting drugs. General instructions Schedule regular health, dental, and eye exams. Stay current with your vaccines. Tell your health care provider if: You often feel depressed. You have ever been abused or do not feel safe at home. Summary Adopting a healthy lifestyle and getting preventive care are important in promoting health and wellness. Follow your health care provider's instructions about healthy diet, exercising, and getting tested or screened for diseases. Follow your health care provider's instructions on monitoring your cholesterol and blood pressure. This information is not intended to replace advice given to you by your health care provider. Make sure you discuss any questions you have with your health care provider. Document Revised: 03/14/2021 Document Reviewed: 03/14/2021 Elsevier Patient Education  2024 [employer].

## 2024-08-13 NOTE — Assessment & Plan Note (Signed)
Continue non pharmacologic treatment. Further recommendations according to lipid panel result.

## 2024-08-13 NOTE — Assessment & Plan Note (Signed)
 Last thyroid  US  in 08/2023:Nodule measuring 2.1 cm, located in the inferior left thyroid  lobe. 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 Thyroid  US  will be arranged for follow up.

## 2024-08-13 NOTE — Progress Notes (Signed)
 Chief Complaint  Patient presents with   Annual Exam   Discussed the use of AI scribe software for clinical note transcription with the patient, who gave verbal consent to proceed.  History of Present Illness [name] is a 63 year old female with PMHx significant for hypertension, varicose veins disease, OSA, GAD, HLD,prediabetes,and somatization disorde here for her annual physical exam. Last CPE: 08/13/23.  She was last seen on 08/06/24, when she was concerned about elevated BP's. She reports that her blood pressure readings have improved. She has lost three pounds since her last visit. She attributes this improvement to dietary changes, including reducing salt intake and increasing vegetable consumption, as well as increased physical activity, walking.  She is currently taking amlodipine  5 mg twice daily and is monitoring her blood pressure closely.  She averages five hours of sleep per night. She does not consume alcohol and has never smoked.  She has periodic eye exams and has a dentist she sees regularly.  She is concerned about her glucose levels due to a family history of diabetes. Requesting HgA1C and glucose test. She had labs done on 08/06/24.  Lab Results  Component Value Date   HGBA1C 6.0 08/06/2024    She has not had a mammogram in the past year, had last one [place]. Her previous gynecologist retired and plans to establish care with a new provider.  She has not yet completed her colon cancer screening.  She had a hysterectomy due to severe pain and bleeding from her periods, with no malignancy found in the pathology.  She takes vitamin D with K2 and magnesium but does not take calcium supplements, preferring to obtain calcium through her diet.  Immunization History  Administered Date(s) Administered   Td 08/21/2008   Tdap 09/10/2019   Zoster Recombinant(Shingrix ) 07/26/2022, 11/20/2022   Health Maintenance  Topic Date Due   Pneumococcal  Vaccine: 50+ Years (1 of 1 - PCV) Never done   Colonoscopy  10/15/2021   Mammogram  09/28/2023   COVID-19 Vaccine (1 - 2024-25 season) Never done   Cervical Cancer Screening (HPV/Pap Cotest)  09/27/2024   Influenza Vaccine  08/10/2025 (Originally 06/06/2024)   DTaP/Tdap/Td (3 - Td or Tdap) 09/09/2029   Hepatitis C Screening  Completed   HIV Screening  Completed   Zoster Vaccines- Shingrix   Completed   Hepatitis B Vaccines 19-59 Average Risk  Aged Out   HPV VACCINES  Aged Out   Meningococcal B Vaccine  Aged Out   HLD on non pharmacologic treatment. Component     Latest Ref Rng 08/13/2023  Cholesterol     0 - 200 mg/dL 799   Triglycerides     0.0 - 149.0 mg/dL 22.9   HDL Cholesterol     >39.00 mg/dL 35.79   VLDL     0.0 - 40.0 mg/dL 84.5   LDL (calc)     0 - 99 mg/dL 879 (H)   Total CHOL/HDL Ratio 3   NonHDL 135.43    Review of Systems  Constitutional:  Negative for activity change, appetite change and fever.  HENT:  Negative for mouth sores, sore throat and trouble swallowing.   Eyes:  Negative for redness and visual disturbance.  Respiratory:  Negative for cough, shortness of breath and wheezing.   Cardiovascular:  Negative for chest pain and leg swelling.  Gastrointestinal:  Negative for abdominal pain, nausea and vomiting.  Endocrine: Negative for cold intolerance, heat intolerance, polydipsia, polyphagia and polyuria.  Genitourinary:  Negative for decreased urine volume, dysuria and hematuria.  Musculoskeletal:  Negative for gait problem and myalgias.  Skin:  Negative for color change and rash.  Allergic/Immunologic: Negative for environmental allergies.  Neurological:  Negative for seizures, syncope, weakness and headaches.  Hematological:  Negative for adenopathy. Does not bruise/bleed easily.  Psychiatric/Behavioral:  Negative for confusion. The patient is nervous/anxious.   All other systems reviewed and are negative.  Current Outpatient Medications on File Prior to  Visit  Medication Sig Dispense Refill   amLODipine  (NORVASC ) 5 MG tablet TAKE 1/2 TABLET TWICE A DAY BY MOUTH 90 tablet 3   No current facility-administered medications on file prior to visit.   Past Medical History:  Diagnosis Date   Anal fissure    hx of    Anxiety    GERD (gastroesophageal reflux disease)    H. pylori infection    Hx: UTI (urinary tract infection)    Hyperlipidemia    Hypertension    Irritable bowel syndrome (IBS)    Past Surgical History:  Procedure Laterality Date   ABDOMINAL HYSTERECTOMY  10/2016    Allergies  Allergen Reactions   Penicillins Rash    Has patient had a PCN reaction causing immediate rash, facial/tongue/throat swelling, SOB or lightheadedness with hypotension: No Has patient had a PCN reaction causing severe rash involving mucus membranes or skin necrosis: No Has patient had a PCN reaction that required hospitalization No Has patient had a PCN reaction occurring within the last 10 years: No If all of the above answers are NO, then may proceed with Cephalosporin use.     Family History  Problem Relation Age of Onset   Diabetes Mother    Heart disease Mother    Stroke Mother    Hypertension Mother    Heart disease Father    Colon cancer Neg Hx     Social History   Socioeconomic History   Marital status: Married    Spouse name: Not on file   Number of children: 3   Years of education: Not on file   Highest education level: Not on file  Occupational History   Not on file  Tobacco Use   Smoking status: Never   Smokeless tobacco: Never  Substance and Sexual Activity   Alcohol use: No   Drug use: No   Sexual activity: Yes  Other Topics Concern   Not on file  Social History Narrative   Caffeine daily    Social Drivers of Health   Financial Resource Strain: Not on file  Food Insecurity: Not on file  Transportation Needs: Not on file  Physical Activity: Not on file  Stress: Not on file  Social Connections: Unknown  (06/12/2022)   Received from [facility]   Social Network    Social Network: Not on file   Vitals:   08/13/24 1123  BP: 130/80  Pulse: 70  Resp: 12  Temp: 98 F (36.7 C)  SpO2: 96%   Body mass index is 32.79 kg/m.  Wt Readings from Last 3 Encounters:  08/13/24 182 lb 3.2 oz (82.6 kg)  08/06/24 185 lb (83.9 kg)  08/13/23 177 lb (80.3 kg)   Physical Exam Vitals and nursing note reviewed.  Constitutional:      General: She is not in acute distress.    Appearance: She is well-developed.  HENT:     Head: Normocephalic and atraumatic.     Right Ear: Tympanic membrane, ear canal and external ear normal.  Left Ear: Tympanic membrane, ear canal and external ear normal.     Mouth/Throat:     Mouth: Mucous membranes are moist.     Pharynx: Oropharynx is clear. Uvula midline.  Eyes:     Extraocular Movements: Extraocular movements intact.     Conjunctiva/sclera: Conjunctivae normal.     Pupils: Pupils are equal, round, and reactive to light.  Neck:     Thyroid : No thyroid  mass or thyromegaly (palpable.).  Cardiovascular:     Rate and Rhythm: Normal rate and regular rhythm.     Pulses:          Dorsalis pedis pulses are 2+ on the right side and 2+ on the left side.     Heart sounds: No murmur heard. Pulmonary:     Effort: Pulmonary effort is normal. No respiratory distress.     Breath sounds: Normal breath sounds.  Abdominal:     Palpations: Abdomen is soft. There is no hepatomegaly or mass.     Tenderness: There is no abdominal tenderness.  Genitourinary:    Comments: Deferred to gyn. Musculoskeletal:     Comments: No signs of synovitis appreciated.  Lymphadenopathy:     Cervical: No cervical adenopathy.     Upper Body:     Right upper body: No supraclavicular adenopathy.     Left upper body: No supraclavicular adenopathy.  Skin:    General: Skin is warm.     Findings: No erythema or rash.  Neurological:     General: No focal deficit present.     Mental Status:  She is alert and oriented to person, place, and time.     Cranial Nerves: No cranial nerve deficit.     Coordination: Coordination normal.     Gait: Gait normal.     Deep Tendon Reflexes:     Reflex Scores:      Bicep reflexes are 2+ on the right side and 2+ on the left side.      Patellar reflexes are 2+ on the right side and 2+ on the left side. Psychiatric:        Mood and Affect: Affect normal. Mood is anxious.    ASSESSMENT AND PLAN: [name] was here today annual physical examination.  Orders Placed This Encounter  Procedures   TSH   Lipid panel   Glucose, random   Ambulatory referral to Gastroenterology   Lab Results  Component Value Date   TSH 1.00 08/13/2024   Lab Results  Component Value Date   CHOL 175 08/13/2024   HDL 54.60 08/13/2024   LDLCALC 108 (H) 08/13/2024   LDLDIRECT 132.4 08/21/2008   LDLDIRECT 132.4 08/21/2008   TRIG 64.0 08/13/2024   CHOLHDL 3 08/13/2024   Routine general medical examination at a health care facility Assessment & Plan: We discussed the importance of regular physical activity and healthy diet for prevention of chronic illness and/or complications. Preventive guidelines reviewed. Vaccination updated, Prevnar 20 given today, does not want flu vaccine. Ca++ and vit D supplementation recommended. She doe snot want mammogram ordered today, planning on establishing with new gynecologist. S/P hysterectomy. Next CPE in a year.   Hyperlipidemia, unspecified hyperlipidemia type Assessment & Plan: Continue non pharmacologic treatment. Further recommendations according to lipid panel result.  Orders: -     Lipid panel; Future  Thyroiditis, chronic, lymphocytic Assessment & Plan: Last thyroid  US  in 08/2023:Nodule measuring 2.1 cm, located in the inferior left thyroid  lobe. Left thyroid  nodule F[place] on 09/02/2021 [place]: Benign follicular  nodule, adenomatosis colloid goiter with cystic degeneration. Coexistent chronic  lymphocytic thyroiditis. Thyroid  US  will be arranged for follow up.  Orders: -     TSH; Future -     US  THYROID ; Future  Hypertension, essential Assessment & Plan: BP has improved. She is on Amlodipine  5 mg bid and would like to continue same regimen. Continue low salt diet. Continue monitoring BP regularly. F/U in 6 months.  Orders: -     amLODIPine  Besylate; Take 2 tablets (10 mg total) by mouth daily.  Dispense: 180 tablet; Refill: 1  Screening for endocrine, metabolic and immunity disorder -     Glucose, random; Future  Need for vaccination -     Pneumococcal conjugate vaccine 20-valent  Colon cancer screening -     Ambulatory referral to Gastroenterology   Return in 6 months (on 02/11/2025) for chronic problems.  Deborah Arp G. [place], MD  [facility]. [place] office.

## 2024-08-25 ENCOUNTER — Ambulatory Visit
Admission: RE | Admit: 2024-08-25 | Discharge: 2024-08-25 | Disposition: A | Discharge status: Home / Self Care | Source: Ambulatory Visit | Attending: Family Medicine | Admitting: Family Medicine

## 2024-08-25 DIAGNOSIS — E063 Autoimmune thyroiditis: Secondary | ICD-10-CM

## 2024-09-05 ENCOUNTER — Telehealth: Payer: Self-pay | Admitting: Family Medicine

## 2024-09-05 NOTE — Telephone Encounter (Signed)
 Spoke with patient regarding medication. Verified dosage with patient and how many tablets she is taking daily. Patient states she is taking 2 tablets a day. Advised patient a 3 month supply for a total of 180 tablets were sent on 08/13/2024 to her pharmacy. Patient states she has tablets unsure of how many and will recheck with the pharmacy. Patient advised she will call back if she does not have correct tablets.

## 2024-09-05 NOTE — Telephone Encounter (Signed)
 Copied from CRM [id number]. Topic: Clinical - Prescription Issue >> Sep 05, 2024  1:19 PM Shardie S wrote: Reason for CRM: Patient states pharmacy is needing updated instructions for amLODipine  (NORVASC ) 5 MG tablet   [id number] - [place], [place] - [address] AT [address] OF [facility] [address] [place] [address] Phone: [phone] Fax: [phone] Hours: Not open 24 hours

## 2024-09-12 ENCOUNTER — Telehealth: Payer: Self-pay

## 2024-09-12 ENCOUNTER — Other Ambulatory Visit: Payer: Self-pay | Admitting: Family Medicine

## 2024-09-12 DIAGNOSIS — I10 Essential (primary) hypertension: Secondary | ICD-10-CM

## 2024-09-12 NOTE — Telephone Encounter (Signed)
 Copied from CRM #[id number]. Topic: Clinical - Medication Refill >> Sep 12, 2024  1:39 PM Ahlexyia S wrote: Medication: amLODipine  (NORVASC ) 5 MG tablet  Has the patient contacted their pharmacy? No, pharmacy contacted pt stating that medication isnt updated. (Agent: If no, request that the patient contact the pharmacy for the refill. If patient does not wish to contact the pharmacy document the reason why and proceed with request.) (Agent: If yes, when and what did the pharmacy advise?)  This is the patient's preferred pharmacy:  [id number] - [place], [place] - [address] AT [address] OF [facility] [address]  [place] [address] Phone: [phone] Fax: [phone]  Is this the correct pharmacy for this prescription? Yes If no, delete pharmacy and type the correct one.   Has the prescription been filled recently? No  Is the patient out of the medication? No, pt only has two days left. Pt isnt really sure.  Has the patient been seen for an appointment in the last year OR does the patient have an upcoming appointment? Yes  Can we respond through MyChart? No, prefers phone calls.  Agent: Please be advised that Rx refills may take up to 3 business days. We ask that you follow-up with your pharmacy.

## 2024-09-12 NOTE — Telephone Encounter (Signed)
 Copied from CRM #[id number]. Topic: Clinical - Prescription Issue >> Sep 12, 2024  1:45 PM Ahlexyia S wrote: Reason for CRM: Patient called in stating pharmacy is needing updated instructions for amLODipine  (NORVASC ) 5 MG tablet. A medication refill request has been put in. Attempted to reach pharmacy for instructions but they were on lunch. Advised pt to contact pharmacy when she gets a chance.

## 2024-09-15 MED ORDER — AMLODIPINE BESYLATE 5 MG PO TABS: 10.0000 mg | ORAL_TABLET | Freq: Every day | ORAL | 1 refills | Status: DC

## 2024-09-15 NOTE — Telephone Encounter (Signed)
 Spoke with patient. Patient states pharmacy has correct RX. Patient states she is waiting for insurance information for coverage of medication prior to picking it up.

## 2024-10-21 ENCOUNTER — Other Ambulatory Visit: Payer: Self-pay | Admitting: Family Medicine

## 2024-10-21 DIAGNOSIS — I10 Essential (primary) hypertension: Secondary | ICD-10-CM
